# Patient Record
Sex: Female | Born: 1985 | Race: Black or African American | Hispanic: Yes | Marital: Single | Smoking: Current some day smoker
Health system: Southern US, Community
[De-identification: ages and names within clinical notes are randomized; demographics above are authoritative.]

## PROBLEM LIST (undated history)

## (undated) ENCOUNTER — Inpatient Hospital Stay (HOSPITAL_COMMUNITY): Payer: Self-pay

## (undated) DIAGNOSIS — I10 Essential (primary) hypertension: Secondary | ICD-10-CM

## (undated) DIAGNOSIS — D219 Benign neoplasm of connective and other soft tissue, unspecified: Secondary | ICD-10-CM

## (undated) DIAGNOSIS — Z3A36 36 weeks gestation of pregnancy: Secondary | ICD-10-CM

## (undated) DIAGNOSIS — Z3A38 38 weeks gestation of pregnancy: Secondary | ICD-10-CM

## (undated) DIAGNOSIS — Z3A33 33 weeks gestation of pregnancy: Secondary | ICD-10-CM

## (undated) DIAGNOSIS — E119 Type 2 diabetes mellitus without complications: Secondary | ICD-10-CM

## (undated) DIAGNOSIS — F99 Mental disorder, not otherwise specified: Secondary | ICD-10-CM

## (undated) DIAGNOSIS — Z3A20 20 weeks gestation of pregnancy: Secondary | ICD-10-CM

## (undated) DIAGNOSIS — Q928 Other specified trisomies and partial trisomies of autosomes: Secondary | ICD-10-CM

## (undated) DIAGNOSIS — O24419 Gestational diabetes mellitus in pregnancy, unspecified control: Secondary | ICD-10-CM

## (undated) DIAGNOSIS — Z3A35 35 weeks gestation of pregnancy: Secondary | ICD-10-CM

## (undated) HISTORY — PX: DILATION AND CURETTAGE OF UTERUS: SHX78

## (undated) HISTORY — DX: Essential (primary) hypertension: I10

## (undated) HISTORY — DX: Benign neoplasm of connective and other soft tissue, unspecified: D21.9

## (undated) HISTORY — DX: Mental disorder, not otherwise specified: F99

---

## 2013-08-10 DIAGNOSIS — F340 Cyclothymic disorder: Secondary | ICD-10-CM | POA: Diagnosis not present

## 2013-08-17 DIAGNOSIS — F340 Cyclothymic disorder: Secondary | ICD-10-CM | POA: Diagnosis not present

## 2013-08-24 DIAGNOSIS — F340 Cyclothymic disorder: Secondary | ICD-10-CM | POA: Diagnosis not present

## 2013-08-26 DIAGNOSIS — F340 Cyclothymic disorder: Secondary | ICD-10-CM | POA: Diagnosis not present

## 2013-09-07 DIAGNOSIS — F340 Cyclothymic disorder: Secondary | ICD-10-CM | POA: Diagnosis not present

## 2013-09-14 DIAGNOSIS — F340 Cyclothymic disorder: Secondary | ICD-10-CM | POA: Diagnosis not present

## 2013-12-19 DIAGNOSIS — O0990 Supervision of high risk pregnancy, unspecified, unspecified trimester: Secondary | ICD-10-CM | POA: Diagnosis not present

## 2013-12-19 DIAGNOSIS — N912 Amenorrhea, unspecified: Secondary | ICD-10-CM | POA: Diagnosis not present

## 2013-12-19 DIAGNOSIS — Z9189 Other specified personal risk factors, not elsewhere classified: Secondary | ICD-10-CM | POA: Diagnosis not present

## 2013-12-19 DIAGNOSIS — Z113 Encounter for screening for infections with a predominantly sexual mode of transmission: Secondary | ICD-10-CM | POA: Diagnosis not present

## 2013-12-19 DIAGNOSIS — Z01419 Encounter for gynecological examination (general) (routine) without abnormal findings: Secondary | ICD-10-CM | POA: Diagnosis not present

## 2013-12-19 DIAGNOSIS — N390 Urinary tract infection, site not specified: Secondary | ICD-10-CM | POA: Diagnosis not present

## 2013-12-21 DIAGNOSIS — Z3480 Encounter for supervision of other normal pregnancy, unspecified trimester: Secondary | ICD-10-CM | POA: Diagnosis not present

## 2013-12-21 DIAGNOSIS — O0990 Supervision of high risk pregnancy, unspecified, unspecified trimester: Secondary | ICD-10-CM | POA: Diagnosis not present

## 2013-12-21 DIAGNOSIS — N912 Amenorrhea, unspecified: Secondary | ICD-10-CM | POA: Diagnosis not present

## 2013-12-26 DIAGNOSIS — O3680X0 Pregnancy with inconclusive fetal viability, not applicable or unspecified: Secondary | ICD-10-CM | POA: Diagnosis not present

## 2013-12-26 DIAGNOSIS — O34219 Maternal care for unspecified type scar from previous cesarean delivery: Secondary | ICD-10-CM | POA: Diagnosis not present

## 2013-12-26 DIAGNOSIS — E663 Overweight: Secondary | ICD-10-CM | POA: Diagnosis not present

## 2014-01-09 DIAGNOSIS — O3680X0 Pregnancy with inconclusive fetal viability, not applicable or unspecified: Secondary | ICD-10-CM | POA: Diagnosis not present

## 2014-01-16 DIAGNOSIS — Z3480 Encounter for supervision of other normal pregnancy, unspecified trimester: Secondary | ICD-10-CM | POA: Diagnosis not present

## 2014-01-16 DIAGNOSIS — Z113 Encounter for screening for infections with a predominantly sexual mode of transmission: Secondary | ICD-10-CM | POA: Diagnosis not present

## 2014-01-16 DIAGNOSIS — Z36 Encounter for antenatal screening of mother: Secondary | ICD-10-CM | POA: Diagnosis not present

## 2014-01-16 LAB — OB RESULTS CONSOLE ANTIBODY SCREEN: Antibody Screen: NEGATIVE

## 2014-01-16 LAB — OB RESULTS CONSOLE PLATELET COUNT: PLATELETS: 276 10*3/uL

## 2014-01-16 LAB — OB RESULTS CONSOLE RUBELLA ANTIBODY, IGM: RUBELLA: IMMUNE

## 2014-01-16 LAB — OB RESULTS CONSOLE HEPATITIS B SURFACE ANTIGEN: Hepatitis B Surface Ag: NEGATIVE

## 2014-01-16 LAB — OB RESULTS CONSOLE VARICELLA ZOSTER ANTIBODY, IGG: Varicella: IMMUNE

## 2014-01-16 LAB — OB RESULTS CONSOLE ABO/RH: RH TYPE: POSITIVE

## 2014-01-16 LAB — OB RESULTS CONSOLE HGB/HCT, BLOOD
HCT: 36 %
Hemoglobin: 11.4 g/dL

## 2014-01-16 LAB — OB RESULTS CONSOLE HIV ANTIBODY (ROUTINE TESTING): HIV: NONREACTIVE

## 2014-01-16 LAB — OB RESULTS CONSOLE RPR: RPR: NONREACTIVE

## 2014-01-18 DIAGNOSIS — O210 Mild hyperemesis gravidarum: Secondary | ICD-10-CM | POA: Diagnosis not present

## 2014-01-18 DIAGNOSIS — O209 Hemorrhage in early pregnancy, unspecified: Secondary | ICD-10-CM | POA: Diagnosis not present

## 2014-01-18 DIAGNOSIS — O99330 Smoking (tobacco) complicating pregnancy, unspecified trimester: Secondary | ICD-10-CM | POA: Diagnosis not present

## 2014-01-18 DIAGNOSIS — O200 Threatened abortion: Secondary | ICD-10-CM | POA: Diagnosis not present

## 2014-01-23 DIAGNOSIS — N760 Acute vaginitis: Secondary | ICD-10-CM | POA: Diagnosis not present

## 2014-01-23 DIAGNOSIS — D509 Iron deficiency anemia, unspecified: Secondary | ICD-10-CM | POA: Diagnosis not present

## 2014-01-23 DIAGNOSIS — O99210 Obesity complicating pregnancy, unspecified trimester: Secondary | ICD-10-CM | POA: Diagnosis not present

## 2014-01-23 DIAGNOSIS — O200 Threatened abortion: Secondary | ICD-10-CM | POA: Diagnosis not present

## 2014-01-23 DIAGNOSIS — E669 Obesity, unspecified: Secondary | ICD-10-CM | POA: Diagnosis not present

## 2014-01-26 DIAGNOSIS — O200 Threatened abortion: Secondary | ICD-10-CM | POA: Diagnosis not present

## 2014-01-26 DIAGNOSIS — F319 Bipolar disorder, unspecified: Secondary | ICD-10-CM | POA: Diagnosis not present

## 2014-01-26 DIAGNOSIS — O99340 Other mental disorders complicating pregnancy, unspecified trimester: Secondary | ICD-10-CM | POA: Diagnosis not present

## 2014-01-29 DIAGNOSIS — O200 Threatened abortion: Secondary | ICD-10-CM | POA: Diagnosis not present

## 2014-02-02 DIAGNOSIS — O200 Threatened abortion: Secondary | ICD-10-CM | POA: Diagnosis not present

## 2014-02-02 DIAGNOSIS — Z9889 Other specified postprocedural states: Secondary | ICD-10-CM | POA: Diagnosis not present

## 2014-02-12 DIAGNOSIS — Z3480 Encounter for supervision of other normal pregnancy, unspecified trimester: Secondary | ICD-10-CM | POA: Diagnosis not present

## 2014-02-12 DIAGNOSIS — Z1389 Encounter for screening for other disorder: Secondary | ICD-10-CM | POA: Diagnosis not present

## 2014-02-12 DIAGNOSIS — O34219 Maternal care for unspecified type scar from previous cesarean delivery: Secondary | ICD-10-CM | POA: Diagnosis not present

## 2014-02-12 DIAGNOSIS — Z36 Encounter for antenatal screening of mother: Secondary | ICD-10-CM | POA: Diagnosis not present

## 2014-02-12 DIAGNOSIS — O358XX0 Maternal care for other (suspected) fetal abnormality and damage, not applicable or unspecified: Secondary | ICD-10-CM | POA: Diagnosis not present

## 2014-03-02 DIAGNOSIS — Z3480 Encounter for supervision of other normal pregnancy, unspecified trimester: Secondary | ICD-10-CM | POA: Diagnosis not present

## 2014-03-02 DIAGNOSIS — Z36 Encounter for antenatal screening of mother: Secondary | ICD-10-CM | POA: Diagnosis not present

## 2014-03-02 DIAGNOSIS — R300 Dysuria: Secondary | ICD-10-CM | POA: Diagnosis not present

## 2014-03-02 DIAGNOSIS — Z3400 Encounter for supervision of normal first pregnancy, unspecified trimester: Secondary | ICD-10-CM | POA: Diagnosis not present

## 2014-04-02 DIAGNOSIS — Z3480 Encounter for supervision of other normal pregnancy, unspecified trimester: Secondary | ICD-10-CM | POA: Diagnosis not present

## 2014-04-02 DIAGNOSIS — E669 Obesity, unspecified: Secondary | ICD-10-CM | POA: Diagnosis not present

## 2014-04-02 DIAGNOSIS — Z0373 Encounter for suspected fetal anomaly ruled out: Secondary | ICD-10-CM | POA: Diagnosis not present

## 2014-04-02 DIAGNOSIS — O99210 Obesity complicating pregnancy, unspecified trimester: Secondary | ICD-10-CM | POA: Diagnosis not present

## 2014-04-03 LAB — GLUCOSE TOLERANCE, 1 HOUR (50G) W/O FASTING: Glucose, 1 Hour GTT: 152

## 2014-04-10 DIAGNOSIS — Z3480 Encounter for supervision of other normal pregnancy, unspecified trimester: Secondary | ICD-10-CM | POA: Diagnosis not present

## 2014-04-10 DIAGNOSIS — N390 Urinary tract infection, site not specified: Secondary | ICD-10-CM | POA: Diagnosis not present

## 2014-04-19 DIAGNOSIS — Z3480 Encounter for supervision of other normal pregnancy, unspecified trimester: Secondary | ICD-10-CM | POA: Diagnosis not present

## 2014-04-19 DIAGNOSIS — O09299 Supervision of pregnancy with other poor reproductive or obstetric history, unspecified trimester: Secondary | ICD-10-CM | POA: Diagnosis not present

## 2014-04-19 DIAGNOSIS — O3440 Maternal care for other abnormalities of cervix, unspecified trimester: Secondary | ICD-10-CM | POA: Diagnosis not present

## 2014-04-19 DIAGNOSIS — E669 Obesity, unspecified: Secondary | ICD-10-CM | POA: Diagnosis not present

## 2014-04-19 DIAGNOSIS — O99210 Obesity complicating pregnancy, unspecified trimester: Secondary | ICD-10-CM | POA: Diagnosis not present

## 2014-04-20 LAB — GLUCOSE TOLERANCE, 3 HOURS
GLUCOSE 1 HOUR GTT: 175 mg/dL (ref ?–200)
GLUCOSE 2 HOUR GTT: 81 mg/dL (ref ?–140)
Glucose, GTT - 3 Hour: 64 mg/dL (ref ?–140)
Glucose, GTT - Fasting: 82 mg/dL (ref 80–110)

## 2014-05-30 DIAGNOSIS — O402XX0 Polyhydramnios, second trimester, not applicable or unspecified: Secondary | ICD-10-CM | POA: Diagnosis not present

## 2014-05-30 DIAGNOSIS — O99810 Abnormal glucose complicating pregnancy: Secondary | ICD-10-CM | POA: Diagnosis not present

## 2014-05-30 DIAGNOSIS — Z3482 Encounter for supervision of other normal pregnancy, second trimester: Secondary | ICD-10-CM | POA: Diagnosis not present

## 2014-05-30 DIAGNOSIS — O09292 Supervision of pregnancy with other poor reproductive or obstetric history, second trimester: Secondary | ICD-10-CM | POA: Diagnosis not present

## 2014-06-09 ENCOUNTER — Inpatient Hospital Stay (HOSPITAL_COMMUNITY)
Admission: AD | Admit: 2014-06-09 | Discharge: 2014-06-09 | Disposition: A | Discharge status: Home / Self Care | Payer: Medicare Other | Source: Ambulatory Visit | Attending: Obstetrics & Gynecology | Admitting: Obstetrics & Gynecology

## 2014-06-09 ENCOUNTER — Encounter (HOSPITAL_COMMUNITY): Payer: Self-pay

## 2014-06-09 DIAGNOSIS — O24419 Gestational diabetes mellitus in pregnancy, unspecified control: Secondary | ICD-10-CM

## 2014-06-09 DIAGNOSIS — O3421 Maternal care for scar from previous cesarean delivery: Secondary | ICD-10-CM | POA: Diagnosis not present

## 2014-06-09 DIAGNOSIS — R1031 Right lower quadrant pain: Secondary | ICD-10-CM | POA: Diagnosis not present

## 2014-06-09 DIAGNOSIS — O0933 Supervision of pregnancy with insufficient antenatal care, third trimester: Secondary | ICD-10-CM | POA: Diagnosis not present

## 2014-06-09 DIAGNOSIS — Z3A28 28 weeks gestation of pregnancy: Secondary | ICD-10-CM | POA: Diagnosis not present

## 2014-06-09 DIAGNOSIS — O0932 Supervision of pregnancy with insufficient antenatal care, second trimester: Secondary | ICD-10-CM

## 2014-06-09 HISTORY — DX: Gestational diabetes mellitus in pregnancy, unspecified control: O24.419

## 2014-06-09 HISTORY — DX: Type 2 diabetes mellitus without complications: E11.9

## 2014-06-09 LAB — URINALYSIS, ROUTINE W REFLEX MICROSCOPIC
GLUCOSE, UA: NEGATIVE mg/dL
Hgb urine dipstick: NEGATIVE
Ketones, ur: 15 mg/dL — AB
LEUKOCYTES UA: NEGATIVE
NITRITE: NEGATIVE
PH: 6 (ref 5.0–8.0)
Protein, ur: NEGATIVE mg/dL
Urobilinogen, UA: 1 mg/dL (ref 0.0–1.0)

## 2014-06-09 LAB — GLUCOSE, CAPILLARY: GLUCOSE-CAPILLARY: 102 mg/dL — AB (ref 70–99)

## 2014-06-09 NOTE — MAU Provider Note (Signed)
History    Patient is 28 y.o. [id number] [redacted]w[redacted]d here with complaints of sharp right lower quadrant pain where her scar is from previous c-sections. States that pain began apx 2 days ago and has had periodic ctx since then. Has had limited prenatal care in [place]. Her first child was born at 30wks via c/s due to failure to progress; subsequent pregnancies resulted in preterm deliveries, though pt is not sure re dates. She had GDM w last pregnancy and has been told that she has it w this pregnancy as well.  She checks her sugars and states that her fasting levels are usually 120 while her post-prandials are 180.   Pt denies fever, dysuria, difficulty w bm, n/v, weakness, ha, vision change, swelling.  +FM, denies LOF, VB, vaginal discharge. Had ctx earlier this evening (periodic) but they have since ceased.    CSN: [id number]  Arrival date & time 06/09/14  0009   First Provider Initiated Contact with Patient 06/09/14 0210      Chief Complaint  Patient presents with  . Abdominal Pain    HPI  Past Medical History  Diagnosis Date  . Diabetes mellitus without complication   . Gestational diabetes     Past Surgical History  Procedure Laterality Date  . Cesarean section    . Dilation and curettage of uterus      History reviewed. No pertinent family history.  History  Substance Use Topics  . Smoking status: Former Research scientist (life sciences)  . Smokeless tobacco: Never Used  . Alcohol Use: No    OB History    Gravida Para Term Preterm AB TAB SAB Ectopic Multiple Living   8 4 1 3 3 2 1   4       Review of Systems  As in HPI  Allergies  Review of patient's allergies indicates no known allergies.  Home Medications  No current outpatient prescriptions on file.  BP 125/63 mmHg  Pulse 88  Temp(Src) 98.2 F (36.8 C) (Oral)  Resp 18  Ht 5\' 6"  (1.676 m)  Wt 121.167 kg (267 lb 2 oz)  BMI 43.14 kg/m2  SpO2 97%  Physical Exam Well nourished, well developed. No acute distress. Resp: No resp  distress.  Cardio: HR normal. PP present and symmetric Abd: No rebound, guarding. Gravida. GU: No CVA tenderness.  Ext: No swelling. Cervical exam: Closed/th/high   MAU Course  Procedures (including critical care time)  Labs Reviewed  URINALYSIS, ROUTINE W REFLEX MICROSCOPIC - Abnormal; Notable for the following:    Specific Gravity, Urine >1.030 (*)    Bilirubin Urine SMALL (*)    Ketones, ur 15 (*)    All other components within normal limits   No results found.   1. Limited prenatal care in second trimester       MDM  NST: Reactive. Category I. No ctx on monitor.  Assessment/Plan Patient is 28 y.o. [id number] [redacted]w[redacted]d by [place] per pt who presented to MAU w sharp LRQ pain.  #Gestation of [redacted]w[redacted]d --Limited prenatal care --No outside records --Per pt is GDM --Plan for [place] this week --Plan for prenatal care here in [place] regarding diet control for sugar  #GDM --POC glucose 102 --Urine w slight ketones --[facility] follow up  #Pain --Does not appear to be contracting on monitor --No dilation on exam --Does not appear to be preterm labor --No evidence of cystitis, pyelonephritis, calculus, appendicitis --Advised to return if pain worsens, contractions return w regularity  OB fellow attestation:  I have seen and examined this patient; I agree with above documentation in the resident's note.   Zissy Hamlett is a 28 y.o. [id number] reporting right lower abdominal pain at incision site.  Patient reports 4 previous cesarean sections, was told she could die with next cesarean section and is concerned about pain.  Reports has thought about BTS but is undecided.  Feels something under incision, not new +FM, denies LOF, VB, contractions, vaginal discharge.  PE: BP 134/75 mmHg  Pulse 87  Temp(Src) 97.5 F (36.4 C) (Oral)  Resp 18  Ht 5\' 6"  (1.676 m)  Wt 267 lb 2 oz (121.167 kg)  BMI 43.14 kg/m2  SpO2 97% Gen: calm comfortable, NAD Resp: normal  effort, no distress Abd: gravid, deep to right lateral incision fibrous nodules palpated which are tender, no rebound tenderness  ROS, labs, PMH reviewed NST reactive  Plan:  [id number] [redacted]w[redacted]d with gestational DM complaints of abdominal pain likely secondary to scar tissue, possible adhesions - fetal kick counts reinforced, preterm labor precautions - continue routine follow up in OB clinic - outpt follow up sono ordered given suspected A/BDM, reinforced need to follow diabetic diet and risk to baby.  - discussed general risks of cesarean section, risk of uterine rupture and patient now considering BTS  Merla Riches, MD 8:46 AM

## 2014-06-09 NOTE — Discharge Instructions (Signed)
Abdominal Pain During Pregnancy °Belly (abdominal) pain is common during pregnancy. Most of the time, it is not a serious problem. Other times, it can be a sign that something is wrong with the pregnancy. Always tell your doctor if you have belly pain. °HOME CARE °Monitor your belly pain for any changes. The following actions may help you feel better: °· Do not have sex (intercourse) or put anything in your vagina until you feel better. °· Rest until your pain stops. °· Drink clear fluids if you feel sick to your stomach (nauseous). Do not eat solid food until you feel better. °· Only take medicine as told by your doctor. °· Keep all doctor visits as told. °GET HELP RIGHT AWAY IF:  °· You are bleeding, leaking fluid, or pieces of tissue come out of your vagina. °· You have more pain or cramping. °· You keep throwing up (vomiting). °· You have pain when you pee (urinate) or have blood in your pee. °· You have a fever. °· You do not feel your baby moving as much. °· You feel very weak or feel like passing out. °· You have trouble breathing, with or without belly pain. °· You have a very bad headache and belly pain. °· You have fluid leaking from your vagina and belly pain. °· You keep having watery poop (diarrhea). °· Your belly pain does not go away after resting, or the pain gets worse. °MAKE SURE YOU:  °· Understand these instructions. °· Will watch your condition. °· Will get help right away if you are not doing well or get worse. °Document Released: 07/08/2009 Document Revised: 03/22/2013 Document Reviewed: 02/16/2013 °ExitCare® Patient Information ©2015 [employer]. This information is not intended to replace advice given to you by your health care provider. Make sure you discuss any questions you have with your health care provider. ° °

## 2014-06-09 NOTE — MAU Note (Signed)
"  On and Off" Abd pain at right lower abd area x 2 days.  Concerned that blood sugar was 115 this morning fasting and was 180, two hours after eating.  Is Gest DM but diet controlled.  Is from [place], has not started [facility] in our hour yet, reports is waiting for her prenatal records to arrive at the [facility]  No bleeding, no leaking.  Baby moving well.

## 2014-06-12 ENCOUNTER — Other Ambulatory Visit (HOSPITAL_COMMUNITY): Payer: Self-pay | Admitting: Family Medicine

## 2014-06-12 ENCOUNTER — Encounter: Payer: Self-pay | Admitting: *Deleted

## 2014-06-12 ENCOUNTER — Ambulatory Visit (HOSPITAL_COMMUNITY)
Admission: RE | Admit: 2014-06-12 | Discharge: 2014-06-12 | Disposition: A | Discharge status: Home / Self Care | Payer: Medicare Other | Source: Ambulatory Visit | Attending: Family Medicine | Admitting: Family Medicine

## 2014-06-12 ENCOUNTER — Other Ambulatory Visit: Payer: Self-pay | Admitting: Advanced Practice Midwife

## 2014-06-12 DIAGNOSIS — O99343 Other mental disorders complicating pregnancy, third trimester: Principal | ICD-10-CM

## 2014-06-12 DIAGNOSIS — O24410 Gestational diabetes mellitus in pregnancy, diet controlled: Secondary | ICD-10-CM | POA: Diagnosis not present

## 2014-06-12 DIAGNOSIS — Z3A29 29 weeks gestation of pregnancy: Secondary | ICD-10-CM | POA: Insufficient documentation

## 2014-06-12 DIAGNOSIS — Z8632 Personal history of gestational diabetes: Secondary | ICD-10-CM | POA: Insufficient documentation

## 2014-06-12 DIAGNOSIS — O352XX0 Maternal care for (suspected) hereditary disease in fetus, not applicable or unspecified: Secondary | ICD-10-CM | POA: Diagnosis not present

## 2014-06-12 DIAGNOSIS — O0933 Supervision of pregnancy with insufficient antenatal care, third trimester: Secondary | ICD-10-CM | POA: Diagnosis not present

## 2014-06-12 DIAGNOSIS — O09213 Supervision of pregnancy with history of pre-term labor, third trimester: Secondary | ICD-10-CM | POA: Diagnosis not present

## 2014-06-12 DIAGNOSIS — O3421 Maternal care for scar from previous cesarean delivery: Secondary | ICD-10-CM | POA: Diagnosis not present

## 2014-06-12 DIAGNOSIS — O0932 Supervision of pregnancy with insufficient antenatal care, second trimester: Secondary | ICD-10-CM

## 2014-06-12 DIAGNOSIS — F319 Bipolar disorder, unspecified: Secondary | ICD-10-CM

## 2014-06-12 DIAGNOSIS — Z87798 Personal history of other (corrected) congenital malformations: Secondary | ICD-10-CM | POA: Insufficient documentation

## 2014-06-12 DIAGNOSIS — O24419 Gestational diabetes mellitus in pregnancy, unspecified control: Secondary | ICD-10-CM | POA: Insufficient documentation

## 2014-06-12 DIAGNOSIS — Z8659 Personal history of other mental and behavioral disorders: Secondary | ICD-10-CM | POA: Insufficient documentation

## 2014-06-12 NOTE — Progress Notes (Signed)
Reviewed results of anatomy scan. Normal, but incomplete. F/U in 4 weeks. Pt tearful. States she is very worried that she and her baby will die from uterine rupture because of Hx C/S x 4. Discussed that risk is present, but low. Warning signs reviewed.  Inquired about other issues w/ anxiety. Pt denies. F/U at [facility] next week as scheduled.

## 2014-06-18 ENCOUNTER — Encounter: Payer: Self-pay | Admitting: Obstetrics & Gynecology

## 2014-06-18 ENCOUNTER — Encounter: Payer: Medicare Other | Attending: Obstetrics & Gynecology | Admitting: *Deleted

## 2014-06-18 ENCOUNTER — Ambulatory Visit (INDEPENDENT_AMBULATORY_CARE_PROVIDER_SITE_OTHER): Payer: Medicare Other | Admitting: Obstetrics & Gynecology

## 2014-06-18 VITALS — Temp 99.4°F | Wt 267.0 lb

## 2014-06-18 DIAGNOSIS — J069 Acute upper respiratory infection, unspecified: Secondary | ICD-10-CM | POA: Diagnosis not present

## 2014-06-18 DIAGNOSIS — O09219 Supervision of pregnancy with history of pre-term labor, unspecified trimester: Secondary | ICD-10-CM

## 2014-06-18 DIAGNOSIS — O3421 Maternal care for scar from previous cesarean delivery: Secondary | ICD-10-CM | POA: Diagnosis not present

## 2014-06-18 DIAGNOSIS — O34219 Maternal care for unspecified type scar from previous cesarean delivery: Secondary | ICD-10-CM | POA: Insufficient documentation

## 2014-06-18 DIAGNOSIS — O24419 Gestational diabetes mellitus in pregnancy, unspecified control: Secondary | ICD-10-CM

## 2014-06-18 DIAGNOSIS — Z713 Dietary counseling and surveillance: Secondary | ICD-10-CM | POA: Insufficient documentation

## 2014-06-18 DIAGNOSIS — O0933 Supervision of pregnancy with insufficient antenatal care, third trimester: Secondary | ICD-10-CM

## 2014-06-18 MED ORDER — AZITHROMYCIN 250 MG PO TABS: ORAL_TABLET | ORAL | Status: DC

## 2014-06-18 MED ORDER — PROMETHAZINE HCL 25 MG PO TABS: 25.0000 mg | ORAL_TABLET | Freq: Four times a day (QID) | ORAL | Status: DC | PRN

## 2014-06-18 NOTE — Progress Notes (Signed)
Nutrition note: 1st visit consult & GDM diet education Pt has GDM & h/o obesity. Pt has gained 2# @ 30w, which is < expected. Pt reports eating 6-7x/d but small amounts each time. Pt is taking a PNV. Pt reports no N/V but has some heartburn. Pt received verbal & written education on GDM diet. Discussed tips to decrease heartburn. Discussed benefits of BF. Discussed wt gain goals of 11-20# or 0.5#/wk. Pt agrees to follow GDM diet with 3 meals & 3 snacks/d with proper CHO/ protein combination. Pt does not have Felicia Foster but plans to apply. Pt is unsure about BF. F/u in 2-4 wks Vladimir Faster, MS, RD, LDN, [facility]

## 2014-06-18 NOTE — Progress Notes (Signed)
Patient scheduled to see Felicia Foster  today for DM education but reports URI symptoms and low grade temp. URI symptoms not responding to any OTC meds.  Temp here 99.4. Advised patient to go to MAU for evaluation but she declined this due to childcare.  Prescribed Azithromycin pack and Phenergan (for nausea) Emphasized that if she does not get any better, she will need evaluation for influenza and further evaluation/management in the MAU. Please see Nancy's note for more details about DM education.

## 2014-06-18 NOTE — Patient Instructions (Signed)
Return to clinic for any obstetric concerns or go to MAU for evaluation  

## 2014-06-18 NOTE — Progress Notes (Signed)
Patient presents for diabetes education. This is her first visit to [facility]. She has moved from [place] 05/18/14. She has been receiving prenatal care. She has a [employer] IQ and adequate testing supplies at this time. She has not tested since 06/06/14. Available readings prior to that date show FBS range 93-113, and 2hpp 117-152m/dl. She is not eating nor testing as directed due to her not feeling well and not eating well at this time. She states she has not met with a dietitian/nutritionist since her pregnancy.  I have consulted Dr. AHarolyn Rutherforddue to patient upper respiratory congestion, fatigue, hot, cold body temp changes, nausea. Oral temp 99.4degrees. Her insurance at this time is Medicare disability due to psychiatric diagnosis. She noted she is attempting to get pregnancy medicaid as well.

## 2014-06-25 ENCOUNTER — Other Ambulatory Visit: Payer: Self-pay | Admitting: Obstetrics & Gynecology

## 2014-06-25 ENCOUNTER — Encounter: Payer: Self-pay | Admitting: Obstetrics & Gynecology

## 2014-06-25 ENCOUNTER — Ambulatory Visit (INDEPENDENT_AMBULATORY_CARE_PROVIDER_SITE_OTHER): Payer: Medicare Other | Admitting: Obstetrics & Gynecology

## 2014-06-25 VITALS — BP 126/84 | HR 103 | Temp 98.4°F | Wt 165.8 lb

## 2014-06-25 DIAGNOSIS — O99810 Abnormal glucose complicating pregnancy: Secondary | ICD-10-CM | POA: Diagnosis not present

## 2014-06-25 DIAGNOSIS — A64 Unspecified sexually transmitted disease: Secondary | ICD-10-CM

## 2014-06-25 DIAGNOSIS — O0933 Supervision of pregnancy with insufficient antenatal care, third trimester: Secondary | ICD-10-CM

## 2014-06-25 DIAGNOSIS — O3421 Maternal care for scar from previous cesarean delivery: Secondary | ICD-10-CM

## 2014-06-25 DIAGNOSIS — O24419 Gestational diabetes mellitus in pregnancy, unspecified control: Secondary | ICD-10-CM

## 2014-06-25 DIAGNOSIS — R7302 Impaired glucose tolerance (oral): Secondary | ICD-10-CM

## 2014-06-25 DIAGNOSIS — O98319 Other infections with a predominantly sexual mode of transmission complicating pregnancy, unspecified trimester: Secondary | ICD-10-CM

## 2014-06-25 DIAGNOSIS — O34219 Maternal care for unspecified type scar from previous cesarean delivery: Secondary | ICD-10-CM

## 2014-06-25 DIAGNOSIS — R7309 Other abnormal glucose: Secondary | ICD-10-CM

## 2014-06-25 DIAGNOSIS — E0865 Diabetes mellitus due to underlying condition with hyperglycemia: Secondary | ICD-10-CM | POA: Diagnosis not present

## 2014-06-25 LAB — POCT URINALYSIS DIP (DEVICE)
BILIRUBIN URINE: NEGATIVE
Glucose, UA: NEGATIVE mg/dL
Hgb urine dipstick: NEGATIVE
KETONES UR: NEGATIVE mg/dL
LEUKOCYTES UA: NEGATIVE
Nitrite: NEGATIVE
Protein, ur: NEGATIVE mg/dL
Specific Gravity, Urine: 1.025 (ref 1.005–1.030)
Urobilinogen, UA: 1 mg/dL (ref 0.0–1.0)
pH: 6.5 (ref 5.0–8.0)

## 2014-06-25 LAB — COMPREHENSIVE METABOLIC PANEL
ALK PHOS: 83 U/L (ref 39–117)
ALT: 12 U/L (ref 0–35)
AST: 12 U/L (ref 0–37)
Albumin: 3.2 g/dL — ABNORMAL LOW (ref 3.5–5.2)
BILIRUBIN TOTAL: 0.2 mg/dL (ref 0.2–1.2)
BUN: 6 mg/dL (ref 6–23)
CO2: 20 mEq/L (ref 19–32)
Calcium: 8.7 mg/dL (ref 8.4–10.5)
Chloride: 106 mEq/L (ref 96–112)
Creat: 0.47 mg/dL — ABNORMAL LOW (ref 0.50–1.10)
Glucose, Bld: 99 mg/dL (ref 70–99)
Potassium: 3.8 mEq/L (ref 3.5–5.3)
SODIUM: 137 meq/L (ref 135–145)
TOTAL PROTEIN: 6.4 g/dL (ref 6.0–8.3)

## 2014-06-25 LAB — CBC
HCT: 30.9 % — ABNORMAL LOW (ref 36.0–46.0)
HEMOGLOBIN: 10.2 g/dL — AB (ref 12.0–15.0)
MCH: 25.1 pg — ABNORMAL LOW (ref 26.0–34.0)
MCHC: 33 g/dL (ref 30.0–36.0)
MCV: 75.9 fL — ABNORMAL LOW (ref 78.0–100.0)
MPV: 10.6 fL (ref 9.4–12.4)
Platelets: 242 10*3/uL (ref 150–400)
RBC: 4.07 MIL/uL (ref 3.87–5.11)
RDW: 13.9 % (ref 11.5–15.5)
WBC: 5.5 10*3/uL (ref 4.0–10.5)

## 2014-06-25 NOTE — Patient Instructions (Signed)
Return to clinic for any obstetric concerns or go to MAU for evaluation  

## 2014-06-25 NOTE — Progress Notes (Signed)
Transferred care from [place], awaiting records. On review of blood sugars since last week; only 2-3 values per day. Patient reports eating one per day.   Fasting 102, 97, 84,91 92, 96  2 hr PP < 120s except for one 141.  Continue diet adherence. Third trimester labs today. No other complaints or concerns.  Labor and fetal movement precautions reviewed.

## 2014-06-25 NOTE — Progress Notes (Signed)
Declined flu/Tdap vaccine Pt reports right leg goes numb. Pt had started care in [place], attempting to receive records.

## 2014-06-26 ENCOUNTER — Encounter: Payer: Self-pay | Admitting: *Deleted

## 2014-06-26 ENCOUNTER — Other Ambulatory Visit: Payer: Self-pay | Admitting: *Deleted

## 2014-06-26 ENCOUNTER — Encounter: Payer: Self-pay | Admitting: Obstetrics & Gynecology

## 2014-06-26 DIAGNOSIS — IMO0002 Reserved for concepts with insufficient information to code with codable children: Secondary | ICD-10-CM

## 2014-06-26 DIAGNOSIS — Z0489 Encounter for examination and observation for other specified reasons: Secondary | ICD-10-CM

## 2014-06-26 LAB — HEMOGLOBIN A1C
HEMOGLOBIN A1C: 6 % — AB (ref <5.7)
Mean Plasma Glucose: 126 mg/dL — ABNORMAL HIGH (ref <117)

## 2014-06-26 LAB — PROTEIN / CREATININE RATIO, URINE
Creatinine, Urine: 218.1 mg/dL
PROTEIN CREATININE RATIO: 0.11 (ref <0.15)
Total Protein, Urine: 25 mg/dL — ABNORMAL HIGH (ref 5–24)

## 2014-06-26 LAB — HIV ANTIBODY (ROUTINE TESTING W REFLEX): HIV 1&2 Ab, 4th Generation: NONREACTIVE

## 2014-06-26 LAB — RPR

## 2014-06-26 NOTE — Addendum Note (Signed)
Addended by: Novella Olive on: 06/26/2014 09:01 AM   Modules accepted: Orders

## 2014-07-03 ENCOUNTER — Encounter (HOSPITAL_COMMUNITY): Payer: Self-pay

## 2014-07-03 ENCOUNTER — Encounter: Payer: Self-pay | Admitting: Obstetrics & Gynecology

## 2014-07-03 ENCOUNTER — Telehealth: Payer: Self-pay

## 2014-07-03 DIAGNOSIS — R12 Heartburn: Secondary | ICD-10-CM

## 2014-07-03 MED ORDER — PANTOPRAZOLE SODIUM 20 MG PO TBEC: 20.0000 mg | DELAYED_RELEASE_TABLET | Freq: Every day | ORAL | Status: DC

## 2014-07-03 NOTE — Telephone Encounter (Signed)
Patient called clinic stating she has been woken up every hour from a contraction and wants to know if she needs to be seen. Patient states she has been having about 1 contraction an hour-- started last night, stopped and then started again this morning. Informed patient that we would be concerned if she were having 6+ contractions an hour, any LOF or bleeding. Patient denies having more than one an hour, LOF or bleeding. Advised patient lay down and drink water. Discussed true signs of labor and advised that if she begins to have 6+ contractions an hour or experiences any LOF or bleeding she come to MAU. Patient verbalized understanding. Patient also c/o of heartburn and being unable to keep food down-- reports having no appetite. Dierdre Conard Novak, CNM agreed to prescribe Protonix 20mg  daily for patient-- patient informed. NO further questions or concerns.

## 2014-07-04 ENCOUNTER — Encounter: Payer: Self-pay | Admitting: *Deleted

## 2014-07-09 ENCOUNTER — Ambulatory Visit (HOSPITAL_COMMUNITY)
Admission: RE | Admit: 2014-07-09 | Discharge: 2014-07-09 | Disposition: A | Discharge status: Home / Self Care | Payer: Medicare Other | Source: Ambulatory Visit | Attending: Obstetrics & Gynecology | Admitting: Obstetrics & Gynecology

## 2014-07-09 ENCOUNTER — Encounter: Payer: Medicare Other | Admitting: Obstetrics & Gynecology

## 2014-07-09 DIAGNOSIS — Z0489 Encounter for examination and observation for other specified reasons: Secondary | ICD-10-CM

## 2014-07-09 DIAGNOSIS — IMO0002 Reserved for concepts with insufficient information to code with codable children: Secondary | ICD-10-CM

## 2014-07-09 DIAGNOSIS — Z36 Encounter for antenatal screening of mother: Secondary | ICD-10-CM | POA: Insufficient documentation

## 2014-07-09 DIAGNOSIS — Z3A33 33 weeks gestation of pregnancy: Secondary | ICD-10-CM | POA: Insufficient documentation

## 2014-07-09 DIAGNOSIS — O24410 Gestational diabetes mellitus in pregnancy, diet controlled: Secondary | ICD-10-CM | POA: Diagnosis not present

## 2014-07-09 DIAGNOSIS — O0933 Supervision of pregnancy with insufficient antenatal care, third trimester: Secondary | ICD-10-CM | POA: Diagnosis not present

## 2014-07-09 DIAGNOSIS — O99213 Obesity complicating pregnancy, third trimester: Secondary | ICD-10-CM | POA: Diagnosis not present

## 2014-07-09 DIAGNOSIS — O3421 Maternal care for scar from previous cesarean delivery: Secondary | ICD-10-CM | POA: Diagnosis not present

## 2014-07-09 NOTE — Progress Notes (Signed)
Cobie S [name]  was seen today for an ultrasound appointment.  See scanned report in media section of EPIC.  Impression: Single IUP at 33w 0d The estimated fetal weight today is at the 71st %tile for gestational age (2317g). Normal interval anatomy Somewhat limited views of the spine and face (upper lip) obtained. Posterior placenta without previa Normal amniotic fluid volume  Recommendations: Recommend follow up ultrasound in 3-4 weeks for interval growth due to GDM.  Benjaman Lobe, MD

## 2014-07-13 ENCOUNTER — Encounter: Payer: Medicare Other | Admitting: Advanced Practice Midwife

## 2014-07-13 ENCOUNTER — Ambulatory Visit (INDEPENDENT_AMBULATORY_CARE_PROVIDER_SITE_OTHER): Payer: Medicare Other | Admitting: Advanced Practice Midwife

## 2014-07-13 VITALS — BP 119/82 | HR 103 | Wt 270.2 lb

## 2014-07-13 DIAGNOSIS — O0933 Supervision of pregnancy with insufficient antenatal care, third trimester: Secondary | ICD-10-CM

## 2014-07-13 LAB — POCT URINALYSIS DIP (DEVICE)
GLUCOSE, UA: NEGATIVE mg/dL
Hgb urine dipstick: NEGATIVE
Ketones, ur: NEGATIVE mg/dL
LEUKOCYTES UA: NEGATIVE
NITRITE: NEGATIVE
Protein, ur: NEGATIVE mg/dL
Specific Gravity, Urine: 1.015 (ref 1.005–1.030)
Urobilinogen, UA: 2 mg/dL — ABNORMAL HIGH (ref 0.0–1.0)
pH: 8.5 — ABNORMAL HIGH (ref 5.0–8.0)

## 2014-07-13 LAB — GLUCOSE, CAPILLARY: Glucose-Capillary: 81 mg/dL (ref 70–99)

## 2014-07-13 MED ORDER — GLUCOSE BLOOD VI STRP: ORAL_STRIP | Status: DC

## 2014-07-13 MED ORDER — CLOTRIMAZOLE-BETAMETHASONE 1-0.05 % EX CREA: 1.0000 "application " | TOPICAL_CREAM | Freq: Two times a day (BID) | CUTANEOUS | Status: DC

## 2014-07-13 MED ORDER — ONETOUCH ULTRASOFT LANCETS MISC: Status: DC

## 2014-07-13 NOTE — Progress Notes (Signed)
Doing well.  Good fetal movement, denies vaginal bleeding, LOF, regular contractions.  Does report some painful contractions yesterday, 4-5/hour.  Also reports heartburn daily but not taking Protonix, rash under left breast, and possible hemorrhoids.  Resume Protonix daily, Tums PRN x next few days until Protonix working well. Lotrisone cream for rash.   Upon visualization, 1 external hemorrhoid noted, not thrombosed.  Discussed use of OTC hemorrhoid creams.  Pt does not have glucose log, has not had lancets/strips.  Reordered materials for pt.  Appt on Monday to f/u on glucose testing.

## 2014-07-13 NOTE — Progress Notes (Signed)
Pt is complaining of heartburn on medication, pt reports burning is down but acid is coming up during the night.\ Pt thinks she has hemorrhoids Pt reports dryness during intercourse Pt needs supplies for glucose meter.

## 2014-07-15 ENCOUNTER — Inpatient Hospital Stay (HOSPITAL_COMMUNITY)
Admission: AD | Admit: 2014-07-15 | Discharge: 2014-07-16 | Disposition: A | Discharge status: Home / Self Care | Payer: Medicare Other | Source: Ambulatory Visit | Attending: Obstetrics and Gynecology | Admitting: Obstetrics and Gynecology

## 2014-07-15 ENCOUNTER — Encounter (HOSPITAL_COMMUNITY): Payer: Self-pay | Admitting: *Deleted

## 2014-07-15 DIAGNOSIS — O0933 Supervision of pregnancy with insufficient antenatal care, third trimester: Secondary | ICD-10-CM

## 2014-07-15 DIAGNOSIS — Z3A33 33 weeks gestation of pregnancy: Secondary | ICD-10-CM | POA: Insufficient documentation

## 2014-07-15 DIAGNOSIS — O24419 Gestational diabetes mellitus in pregnancy, unspecified control: Secondary | ICD-10-CM

## 2014-07-15 DIAGNOSIS — O9989 Other specified diseases and conditions complicating pregnancy, childbirth and the puerperium: Secondary | ICD-10-CM | POA: Diagnosis not present

## 2014-07-15 DIAGNOSIS — O352XX1 Maternal care for (suspected) hereditary disease in fetus, fetus 1: Secondary | ICD-10-CM

## 2014-07-15 DIAGNOSIS — Z87891 Personal history of nicotine dependence: Secondary | ICD-10-CM | POA: Diagnosis not present

## 2014-07-15 DIAGNOSIS — Z3A34 34 weeks gestation of pregnancy: Secondary | ICD-10-CM | POA: Diagnosis not present

## 2014-07-15 DIAGNOSIS — O34219 Maternal care for unspecified type scar from previous cesarean delivery: Secondary | ICD-10-CM

## 2014-07-15 DIAGNOSIS — Y93E5 Activity, floor mopping and cleaning: Secondary | ICD-10-CM | POA: Insufficient documentation

## 2014-07-15 DIAGNOSIS — O09219 Supervision of pregnancy with history of pre-term labor, unspecified trimester: Secondary | ICD-10-CM

## 2014-07-15 DIAGNOSIS — O4703 False labor before 37 completed weeks of gestation, third trimester: Secondary | ICD-10-CM | POA: Diagnosis not present

## 2014-07-15 DIAGNOSIS — W01198A Fall on same level from slipping, tripping and stumbling with subsequent striking against other object, initial encounter: Secondary | ICD-10-CM | POA: Insufficient documentation

## 2014-07-15 LAB — PROTEIN / CREATININE RATIO, URINE
CREATININE, URINE: 289.31 mg/dL
PROTEIN CREATININE RATIO: 0.25 — AB (ref 0.00–0.15)
Total Protein, Urine: 72.9 mg/dL

## 2014-07-15 LAB — URINALYSIS, ROUTINE W REFLEX MICROSCOPIC
Bilirubin Urine: NEGATIVE
Glucose, UA: NEGATIVE mg/dL
Hgb urine dipstick: NEGATIVE
Ketones, ur: NEGATIVE mg/dL
Leukocytes, UA: NEGATIVE
NITRITE: NEGATIVE
PROTEIN: 100 mg/dL — AB
Specific Gravity, Urine: 1.02 (ref 1.005–1.030)
UROBILINOGEN UA: 1 mg/dL (ref 0.0–1.0)
pH: 7 (ref 5.0–8.0)

## 2014-07-15 LAB — CBC
HEMATOCRIT: 30.8 % — AB (ref 36.0–46.0)
Hemoglobin: 10 g/dL — ABNORMAL LOW (ref 12.0–15.0)
MCH: 24.7 pg — ABNORMAL LOW (ref 26.0–34.0)
MCHC: 32.5 g/dL (ref 30.0–36.0)
MCV: 76 fL — AB (ref 78.0–100.0)
Platelets: 230 10*3/uL (ref 150–400)
RBC: 4.05 MIL/uL (ref 3.87–5.11)
RDW: 13.2 % (ref 11.5–15.5)
WBC: 6.8 10*3/uL (ref 4.0–10.5)

## 2014-07-15 LAB — COMPREHENSIVE METABOLIC PANEL
ALT: 8 U/L (ref 0–35)
ANION GAP: 13 (ref 5–15)
AST: 9 U/L (ref 0–37)
Albumin: 2.3 g/dL — ABNORMAL LOW (ref 3.5–5.2)
Alkaline Phosphatase: 95 U/L (ref 39–117)
BILIRUBIN TOTAL: 0.2 mg/dL — AB (ref 0.3–1.2)
BUN: 7 mg/dL (ref 6–23)
CO2: 21 mEq/L (ref 19–32)
CREATININE: 0.62 mg/dL (ref 0.50–1.10)
Calcium: 8.8 mg/dL (ref 8.4–10.5)
Chloride: 103 mEq/L (ref 96–112)
GFR calc non Af Amer: >90 mL/min (ref >90)
Glucose, Bld: 101 mg/dL — ABNORMAL HIGH (ref 70–99)
Potassium: 3.9 mEq/L (ref 3.7–5.3)
Sodium: 137 mEq/L (ref 137–147)
TOTAL PROTEIN: 6.5 g/dL (ref 6.0–8.3)

## 2014-07-15 LAB — GLUCOSE, CAPILLARY: Glucose-Capillary: 95 mg/dL (ref 70–99)

## 2014-07-15 LAB — URINE MICROSCOPIC-ADD ON

## 2014-07-15 MED ORDER — OXYCODONE-ACETAMINOPHEN 5-325 MG PO TABS
1.0000 | ORAL_TABLET | Freq: Once | ORAL | Status: AC
  Administered 2014-07-15: 1 via ORAL
  Filled 2014-07-15: qty 1

## 2014-07-15 MED ORDER — NIFEDIPINE 10 MG PO CAPS
10.0000 mg | ORAL_CAPSULE | Freq: Once | ORAL | Status: AC
  Administered 2014-07-15: 10 mg via ORAL
  Filled 2014-07-15: qty 1

## 2014-07-15 NOTE — MAU Note (Signed)
Pt has not taken blood sugar in five days. Pharmacy did not have lancets and she did not feel well enough to go pick them up when they were available.

## 2014-07-15 NOTE — MAU Note (Signed)
Pt states reports falling at about 4:30 this afternoon. Pt landed on her L hip. She reports pain in her pelvis and lower back. Denies leaking or bleeding. +FM. Pt states that she has been having contractions since earlier today, but the contractions are now stronger since the fall.

## 2014-07-15 NOTE — MAU Note (Signed)
CBG 95mg /dl

## 2014-07-15 NOTE — MAU Provider Note (Signed)
History     CSN: [id number]  Arrival date and time: 07/15/14 9242   First Provider Initiated Contact with Patient 07/15/14 2101      Chief Complaint  Patient presents with  . Fall  . Contractions   HPI  Patient is 28 y.o. [id number] [redacted]w[redacted]d here with complaints of fall.  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  She denies any trauma to abdomen or head.  No LOC.    Since her fall, her contractions have increased in frequency to about q81min.  She is also having pain in pelvis.  She reports recent transfer of [facility] to [facility] from [place].  Reports they are uncure of GDM status.  +FM, denies LOF, VB, vaginal discharge.     Past Medical History  Diagnosis Date  . Fibroids   . Mental disorder     bipolar  . Diabetes mellitus without complication   . Gestational diabetes     Past Surgical History  Procedure Laterality Date  . Cesarean section      x 4  . Dilation and curettage of uterus      Family History  Problem Relation Age of Onset  . Cancer Mother     History  Substance Use Topics  . Smoking status: Former Research scientist (life sciences)  . Smokeless tobacco: Never Used  . Alcohol Use: No    Allergies: No Known Allergies  Prescriptions prior to admission  Medication Sig Dispense Refill Last Dose  . clotrimazole-betamethasone (LOTRISONE) cream Apply 1 application topically 2 (two) times daily. 30 g 0 07/15/2014 at Unknown time  . famotidine (PEPCID) 10 MG tablet Take 10 mg by mouth 2 (two) times daily.   07/14/2014 at Unknown time  . pantoprazole (PROTONIX) 20 MG tablet Take 1 tablet (20 mg total) by mouth daily. 30 tablet 1 Past Month at Unknown time  . Prenatal Vit-Fe Fumarate-FA (PRENATAL MULTIVITAMIN) TABS tablet Take 1 tablet by mouth daily at 12 noon.   07/13/2014 at Unknown time  . promethazine (PHENERGAN) 25 MG tablet Take 1 tablet (25 mg total) by mouth every 6 (six) hours as needed for nausea or vomiting. 30 tablet 2 Past Month at Unknown  time  . azithromycin (ZITHROMAX) 250 MG tablet Take as directed: Two pills the first day, then one pill every day until completed (Patient not taking: Reported on 07/13/2014) 6 tablet 1 Not Taking  . glucose blood test strip Use as instructed 100 each 12   . Lancets (ONETOUCH ULTRASOFT) lancets Use as instructed 100 each 12     Review of Systems  Constitutional: Negative for fever, chills and weight loss.  Eyes: Negative for blurred vision.  Respiratory: Negative for cough and shortness of breath.   Cardiovascular: Negative for chest pain and leg swelling.  Gastrointestinal: Negative for nausea, vomiting and abdominal pain.  Genitourinary: Negative for dysuria, urgency, frequency, hematuria and flank pain.  Musculoskeletal: Negative for back pain.  Skin: Negative for itching.  Neurological: Positive for headaches.   Physical Exam   Blood pressure 140/78, pulse 100, temperature 98.7 F (37.1 C), resp. rate 18, height 5\' 6"  (1.676 m), weight 123.106 kg (271 lb 6.4 oz), last menstrual period 11/14/2013.  Physical Exam  Constitutional: She is oriented to person, place, and time. She appears well-developed and well-nourished. No distress.  HENT:  Head: Normocephalic and atraumatic.  Cardiovascular: Normal rate and intact distal pulses.   Respiratory: Effort normal. No respiratory distress.  GI: There is no tenderness.  There is no rebound and no guarding.  Genitourinary: Vagina normal.  Cervix: Closed, thick, high  Musculoskeletal: She exhibits no edema or tenderness.  Neurological: She is alert and oriented to person, place, and time.  Skin: Skin is warm and dry.    MAU Course  Procedures  MDM NST: Cat 1, reactive, initially tachy but resolved with PO intake Initially with contractions q3-7 min on toco - spaced out and uterine irritability present s/p 2 doses of Procardia BP initially elevated to 140/78, when cuff applied correctly, BPs 130s/70-80s, no severe features. HELLP  labs wnl Given procardia for contractions and percocet for pain. Cervix closed/thick/high and unchanged on recheck after 2 hours of observation   Assessment and Plan  Patient is 28 y.o. E0P2330 [redacted]w[redacted]d reporting contractions and pelvic pain secondary to fall - fetal kick counts reinforced - preterm labor precautions - f/u in [facility] tomorrow AM for scheduled prenatal visit   Lavon Paganini 07/15/2014, 9:01 PM

## 2014-07-16 ENCOUNTER — Ambulatory Visit (INDEPENDENT_AMBULATORY_CARE_PROVIDER_SITE_OTHER): Payer: Medicare Other | Admitting: Obstetrics and Gynecology

## 2014-07-16 ENCOUNTER — Encounter: Payer: Medicare Other | Attending: Obstetrics & Gynecology | Admitting: *Deleted

## 2014-07-16 VITALS — BP 133/83 | HR 88 | Temp 98.1°F | Wt 268.8 lb

## 2014-07-16 DIAGNOSIS — O09213 Supervision of pregnancy with history of pre-term labor, third trimester: Secondary | ICD-10-CM

## 2014-07-16 DIAGNOSIS — O34219 Maternal care for unspecified type scar from previous cesarean delivery: Secondary | ICD-10-CM

## 2014-07-16 DIAGNOSIS — Z3A34 34 weeks gestation of pregnancy: Secondary | ICD-10-CM | POA: Diagnosis not present

## 2014-07-16 DIAGNOSIS — O0933 Supervision of pregnancy with insufficient antenatal care, third trimester: Secondary | ICD-10-CM

## 2014-07-16 DIAGNOSIS — O24419 Gestational diabetes mellitus in pregnancy, unspecified control: Secondary | ICD-10-CM | POA: Insufficient documentation

## 2014-07-16 DIAGNOSIS — O4703 False labor before 37 completed weeks of gestation, third trimester: Secondary | ICD-10-CM

## 2014-07-16 DIAGNOSIS — Z713 Dietary counseling and surveillance: Secondary | ICD-10-CM | POA: Insufficient documentation

## 2014-07-16 DIAGNOSIS — O3421 Maternal care for scar from previous cesarean delivery: Secondary | ICD-10-CM

## 2014-07-16 LAB — POCT URINALYSIS DIP (DEVICE)
Bilirubin Urine: NEGATIVE
GLUCOSE, UA: NEGATIVE mg/dL
HGB URINE DIPSTICK: NEGATIVE
Ketones, ur: NEGATIVE mg/dL
Leukocytes, UA: NEGATIVE
NITRITE: NEGATIVE
Protein, ur: NEGATIVE mg/dL
Specific Gravity, Urine: 1.02 (ref 1.005–1.030)
UROBILINOGEN UA: 1 mg/dL (ref 0.0–1.0)
pH: 7 (ref 5.0–8.0)

## 2014-07-16 MED ORDER — ACCU-CHEK FASTCLIX LANCETS MISC: 1.0000 | Freq: Four times a day (QID) | Status: DC

## 2014-07-16 MED ORDER — GLUCOSE BLOOD VI STRP: ORAL_STRIP | Status: DC

## 2014-07-16 NOTE — Progress Notes (Signed)
Doing well today. Seen in triage last night for contractions, discharged with procardia. Continues to have some contractions today, but less severe.  1. GDM. Not checking blood sugars as she does not have supplies. See diabetic educator to obtain supplies. Follow up one week to review blood sugars.  2. H/o c/s x 4. Needs repeat section at 39 weeks.

## 2014-07-16 NOTE — Progress Notes (Signed)
Felicia Foster has recently moved from [place]. She has been using a [employer] IQ meter which is not covered by her Medicare Disability in [place]. I have dispensed: Accu-Chek Nano Lot: [id number] Exp: 03/03/15 FBS 90mg /dl She notes that her eating pattern is non-traditional. She goes for multiple hours without eating, Snacks and goes back to sleep. She is having trouble with "heartburn and nausea". She is taking Pepcid, not the Protonix nor phenergen.  I have asked that she take her glucose whenever she gets up for the day (3:00pm) and a couple random times during the day. We will review her glucose readings next week.

## 2014-07-16 NOTE — Discharge Instructions (Signed)

## 2014-07-16 NOTE — Progress Notes (Signed)
Reports went to MAU yesterday for contractions-- was given procardia. Reports she is still having contractions today-- reports she has been up for an hour and have had a contraction every 6-68minutes. Denies LOF or bleeding.  Declines flu and tdap.

## 2014-07-16 NOTE — Addendum Note (Signed)
Addended by: Bernie Covey on: 07/16/2014 11:34 AM   Modules accepted: Orders, Medications

## 2014-07-23 ENCOUNTER — Ambulatory Visit (INDEPENDENT_AMBULATORY_CARE_PROVIDER_SITE_OTHER): Payer: Medicare Other | Admitting: Obstetrics and Gynecology

## 2014-07-23 VITALS — BP 135/83 | HR 98 | Temp 98.9°F | Wt 269.6 lb

## 2014-07-23 DIAGNOSIS — O3421 Maternal care for scar from previous cesarean delivery: Secondary | ICD-10-CM

## 2014-07-23 DIAGNOSIS — O99343 Other mental disorders complicating pregnancy, third trimester: Secondary | ICD-10-CM

## 2014-07-23 DIAGNOSIS — O0933 Supervision of pregnancy with insufficient antenatal care, third trimester: Secondary | ICD-10-CM

## 2014-07-23 DIAGNOSIS — F319 Bipolar disorder, unspecified: Secondary | ICD-10-CM

## 2014-07-23 DIAGNOSIS — O34219 Maternal care for unspecified type scar from previous cesarean delivery: Secondary | ICD-10-CM

## 2014-07-23 DIAGNOSIS — O24419 Gestational diabetes mellitus in pregnancy, unspecified control: Secondary | ICD-10-CM

## 2014-07-23 DIAGNOSIS — Z87798 Personal history of other (corrected) congenital malformations: Secondary | ICD-10-CM

## 2014-07-23 LAB — POCT URINALYSIS DIP (DEVICE)
Glucose, UA: NEGATIVE mg/dL
Hgb urine dipstick: NEGATIVE
Ketones, ur: NEGATIVE mg/dL
Nitrite: NEGATIVE
Protein, ur: 30 mg/dL — AB
Specific Gravity, Urine: 1.015 (ref 1.005–1.030)
Urobilinogen, UA: 4 mg/dL — ABNORMAL HIGH (ref 0.0–1.0)
pH: 7 (ref 5.0–8.0)

## 2014-07-23 NOTE — Progress Notes (Signed)
29 y.o. [id number] at [redacted]w[redacted]d. Doing well today.  1. GDM. Provided with supplies for testing last week. Did not bring log today. States AM fasting in the 80s and postprandial 110-120. Discussed diet/exercise. May need to start medications. Growth scan at [redacted]w[redacted]d EFW 2317 gm (71%ile). Needs follow up ultrasound between 36-[redacted] weeks gestation, ordered today.  2. History of c/s x4. Needs repeat section at 39 weeks.  3. Routine PNC. Having braxton hicks contractions. FM/PTL precautions reviewed.

## 2014-07-23 NOTE — Progress Notes (Signed)
Patient reports very intense contractions last night that were very painful

## 2014-07-23 NOTE — Progress Notes (Signed)
Growth U/S 08/01/14 @ 3p.

## 2014-07-30 ENCOUNTER — Encounter: Payer: Medicare Other | Admitting: Family Medicine

## 2014-08-01 ENCOUNTER — Other Ambulatory Visit: Payer: Self-pay | Admitting: Family Medicine

## 2014-08-01 ENCOUNTER — Encounter (HOSPITAL_COMMUNITY): Payer: Self-pay | Admitting: General Practice

## 2014-08-01 ENCOUNTER — Inpatient Hospital Stay (HOSPITAL_COMMUNITY)
Admission: AD | Admit: 2014-08-01 | Discharge: 2014-08-01 | Disposition: A | Discharge status: Home / Self Care | Payer: Medicare Other | Source: Ambulatory Visit | Attending: Obstetrics & Gynecology | Admitting: Obstetrics & Gynecology

## 2014-08-01 ENCOUNTER — Ambulatory Visit (INDEPENDENT_AMBULATORY_CARE_PROVIDER_SITE_OTHER): Payer: Medicare Other | Admitting: Family Medicine

## 2014-08-01 ENCOUNTER — Ambulatory Visit (HOSPITAL_COMMUNITY): Payer: Medicare Other

## 2014-08-01 ENCOUNTER — Other Ambulatory Visit: Payer: Self-pay | Admitting: Advanced Practice Midwife

## 2014-08-01 VITALS — BP 142/92 | HR 87 | Temp 98.8°F | Wt 273.7 lb

## 2014-08-01 DIAGNOSIS — O0933 Supervision of pregnancy with insufficient antenatal care, third trimester: Secondary | ICD-10-CM

## 2014-08-01 DIAGNOSIS — Z87891 Personal history of nicotine dependence: Secondary | ICD-10-CM | POA: Diagnosis not present

## 2014-08-01 DIAGNOSIS — O24410 Gestational diabetes mellitus in pregnancy, diet controlled: Secondary | ICD-10-CM

## 2014-08-01 DIAGNOSIS — O34219 Maternal care for unspecified type scar from previous cesarean delivery: Secondary | ICD-10-CM

## 2014-08-01 DIAGNOSIS — O3421 Maternal care for scar from previous cesarean delivery: Secondary | ICD-10-CM | POA: Insufficient documentation

## 2014-08-01 DIAGNOSIS — Z3A36 36 weeks gestation of pregnancy: Secondary | ICD-10-CM | POA: Insufficient documentation

## 2014-08-01 DIAGNOSIS — O0993 Supervision of high risk pregnancy, unspecified, third trimester: Secondary | ICD-10-CM

## 2014-08-01 DIAGNOSIS — O133 Gestational [pregnancy-induced] hypertension without significant proteinuria, third trimester: Secondary | ICD-10-CM

## 2014-08-01 DIAGNOSIS — O479 False labor, unspecified: Secondary | ICD-10-CM

## 2014-08-01 LAB — URINALYSIS, ROUTINE W REFLEX MICROSCOPIC
Bilirubin Urine: NEGATIVE
GLUCOSE, UA: NEGATIVE mg/dL
HGB URINE DIPSTICK: NEGATIVE
KETONES UR: NEGATIVE mg/dL
Nitrite: NEGATIVE
PH: 7 (ref 5.0–8.0)
Protein, ur: NEGATIVE mg/dL
Specific Gravity, Urine: 1.02 (ref 1.005–1.030)
Urobilinogen, UA: 1 mg/dL (ref 0.0–1.0)

## 2014-08-01 LAB — CBC
HCT: 31.1 % — ABNORMAL LOW (ref 36.0–46.0)
HEMOGLOBIN: 10.2 g/dL — AB (ref 12.0–15.0)
MCH: 24.5 pg — ABNORMAL LOW (ref 26.0–34.0)
MCHC: 32.8 g/dL (ref 30.0–36.0)
MCV: 74.8 fL — ABNORMAL LOW (ref 78.0–100.0)
Platelets: 219 10*3/uL (ref 150–400)
RBC: 4.16 MIL/uL (ref 3.87–5.11)
RDW: 13.4 % (ref 11.5–15.5)
WBC: 5.9 10*3/uL (ref 4.0–10.5)

## 2014-08-01 LAB — COMPREHENSIVE METABOLIC PANEL
ALT: 9 U/L (ref 0–35)
ANION GAP: 8 (ref 5–15)
AST: 12 U/L (ref 0–37)
Albumin: 2.6 g/dL — ABNORMAL LOW (ref 3.5–5.2)
Alkaline Phosphatase: 107 U/L (ref 39–117)
BILIRUBIN TOTAL: 0.4 mg/dL (ref 0.3–1.2)
BUN: 6 mg/dL (ref 6–23)
CALCIUM: 8.7 mg/dL (ref 8.4–10.5)
CHLORIDE: 105 meq/L (ref 96–112)
CO2: 22 mmol/L (ref 19–32)
CREATININE: 0.5 mg/dL (ref 0.50–1.10)
GFR calc non Af Amer: >90 mL/min (ref >90)
Glucose, Bld: 84 mg/dL (ref 70–99)
POTASSIUM: 3.9 mmol/L (ref 3.5–5.1)
Sodium: 135 mmol/L (ref 135–145)
TOTAL PROTEIN: 7.2 g/dL (ref 6.0–8.3)

## 2014-08-01 LAB — PROTEIN / CREATININE RATIO, URINE
Creatinine, Urine: 327 mg/dL
PROTEIN CREATININE RATIO: 0.11 (ref 0.00–0.15)
Total Protein, Urine: 36 mg/dL

## 2014-08-01 LAB — URINE MICROSCOPIC-ADD ON

## 2014-08-01 MED ORDER — BUTALBITAL-APAP-CAFFEINE 50-325-40 MG PO TABS
1.0000 | ORAL_TABLET | Freq: Four times a day (QID) | ORAL | Status: DC | PRN
Start: 2014-08-01 — End: 2014-08-11

## 2014-08-01 NOTE — MAU Provider Note (Signed)
Chief Complaint:  No chief complaint on file.   First Provider Initiated Contact with Patient 08/01/14 1645     HPI: Felicia Foster is a 28 y.o. [id number] at [redacted]w[redacted]d who sent to maternity admissions from [facility] outpatient clinic for preeclampsia evaluation and labor evaluation.  The pressure was elevated for the first time today in clinic. Patient also reports increased contractions, soreness at her old C-section incision and headaches 2 weeks that are partially relieved with Tylenol. No headache now.   Patient attributes her C-section scar soreness to her increasing contractions. After reviewing risk of uterine rupture in patient with prior C-sections patient states she is very certain that this is Denies fever, chills, vision changes, epigastric pain, leakage of fluid or vaginal bleeding. Good fetal movement.   Pregnancy Course: Diet-controlled gestational diabetes, history of C-section 4  Past Medical History: Past Medical History  Diagnosis Date  . Fibroids   . Mental disorder     bipolar  . Diabetes mellitus without complication   . 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 Cesarean section      x 4  . Dilation and curettage of uterus       Family  History: Family History  Problem Relation Age of Onset  . Cancer Mother     Social History: History  Substance Use Topics  . Smoking status: Former Research scientist (life sciences)  . Smokeless tobacco: Never Used  . Alcohol Use: No    Allergies: No Known Allergies  Meds:  Prescriptions prior to admission  Medication Sig Dispense Refill Last Dose  . acetaminophen (TYLENOL) 325 MG tablet Take 650 mg by mouth every 6 (six) hours as needed for headache.   Past Week at Unknown time  . clotrimazole-betamethasone (LOTRISONE) cream Apply 1 application topically 2 (two) times daily. 30 g 0 07/31/2014 at 2200  . famotidine (PEPCID) 10 MG tablet Take 10 mg by mouth daily.    07/31/2014 at 2200  . Prenatal Vit-Fe Fumarate-FA (PRENATAL MULTIVITAMIN) TABS tablet Take 1 tablet by mouth daily at 12 noon.   Past Week at Unknown time  . promethazine (PHENERGAN) 25 MG tablet Take 25 mg by mouth every 6 (six) hours as needed for nausea or vomiting.   Past Week at Unknown time  . ACCU-CHEK FASTCLIX LANCETS  MISC 1 each by Percutaneous route 4 (four) times daily. (Patient not taking: Reported on 08/01/2014) 100 each 12 Not Taking at Unknown time  . glucose blood (ACCU-CHEK ACTIVE STRIPS) test strip Use as instructed (Patient not taking: Reported on 08/01/2014) 1 each 12 Not Taking at Unknown time    ROS: Pertinent positive findings in history of present illness. Neg for fever, chills, vision changes, epigastric pain, leakage of fluid or vaginal bleeding.   Physical Exam  Blood pressure 146/110, pulse 93, resp. rate 18, last menstrual period 11/14/2013.  Patient Vitals for the past 24 hrs:  BP Pulse Resp  08/01/14 1913 139/95 mmHg 90 18  08/01/14 1907 (!) 146/110 mmHg 93 18  08/01/14 1846 130/85 mmHg 97 -  08/01/14 1830 130/85 mmHg 94 -  08/01/14 1816 126/80 mmHg 93 -  08/01/14 1801 131/80 mmHg 92 -  08/01/14 1745 140/88 mmHg 88 -  08/01/14 1731 129/84 mmHg 88 -  08/01/14 1716 133/82 mmHg 89 -  08/01/14 1631 140/85 mmHg  92 -  08/01/14 1616 152/73 mmHg 91 -  08/01/14 1546 145/88 mmHg 91 -  08/01/14 1530 143/93 mmHg 92 -  08/01/14 1516 127/82 mmHg 89 -  08/01/14 1502 123/70 mmHg 97 -  08/01/14 1454 154/93 mmHg 92 -  08/01/14 1451 (!) 154/105 mmHg 96 -  08/01/14 1450 154/93 mmHg 91 18    GENERAL: Well-developed, well-nourished female in no acute distress.  HEENT: normocephalic HEART: normal rate RESP: normal effort ABDOMEN: Soft, non-tender, gravid appropriate for gestational age. Well-healed low transverse incision. NT.  EXTREMITIES: Nontender, 1+ edema NEURO: alert and oriented SPECULUM EXAM: Deferred   Cervix closed and long.  FHT:  Baseline 145 , moderate variability, accelerations present, no decelerations Contractions: q 2-8 mins, mild-mod   Labs: Results for orders placed or performed during the hospital encounter of 08/01/14 (from the past 24 hour(s))  CBC     Status: Abnormal   Collection Time: 08/01/14  4:13 PM  Result Value Ref Range   WBC 5.9 4.0 - 10.5 K/uL   RBC 4.16 3.87 - 5.11 MIL/uL   Hemoglobin 10.2 (L) 12.0 - 15.0 g/dL   HCT 31.1 (L) 36.0 - 46.0 %   MCV 74.8 (L) 78.0 - 100.0 fL   MCH 24.5 (L) 26.0 - 34.0 pg   MCHC 32.8 30.0 - 36.0 g/dL   RDW 13.4 11.5 - 15.5 %   Platelets 219 150 - 400 K/uL  Comprehensive metabolic panel     Status: Abnormal   Collection Time: 08/01/14  4:13 PM  Result Value Ref Range   Sodium 135 135 - 145 mmol/L   Potassium 3.9 3.5 - 5.1 mmol/L   Chloride 105 96 - 112 mEq/L   CO2 22 19 - 32 mmol/L   Glucose, Bld 84 70 - 99 mg/dL   BUN 6 6 - 23 mg/dL   Creatinine, Ser 0.50 0.50 - 1.10 mg/dL   Calcium 8.7 8.4 - 10.5 mg/dL   Total Protein 7.2 6.0 - 8.3 g/dL   Albumin 2.6 (L) 3.5 - 5.2 g/dL   AST 12 0 - 37 U/L   ALT 9 0 - 35 U/L   Alkaline Phosphatase 107 39 - 117 U/L   Total Bilirubin 0.4 0.3 - 1.2 mg/dL   GFR calc non Af Amer >90 >90 mL/min   GFR calc Af Amer >90 >90 mL/min   Anion gap 8 5 - 15  Protein / creatinine ratio, urine     Status:  None  Collection Time: 08/01/14  4:45 PM  Result Value Ref Range   Creatinine, Urine 327.00 mg/dL   Total Protein, Urine 36 mg/dL   Protein Creatinine Ratio 0.11 0.00 - 0.15  Urinalysis, Routine w reflex microscopic     Status: Abnormal   Collection Time: 08/01/14  4:45 PM  Result Value Ref Range   Color, Urine AMBER (A) YELLOW   APPearance CLEAR CLEAR   Specific Gravity, Urine 1.020 1.005 - 1.030   pH 7.0 5.0 - 8.0   Glucose, UA NEGATIVE NEGATIVE mg/dL   Hgb urine dipstick NEGATIVE NEGATIVE   Bilirubin Urine NEGATIVE NEGATIVE   Ketones, ur NEGATIVE NEGATIVE mg/dL   Protein, ur NEGATIVE NEGATIVE mg/dL   Urobilinogen, UA 1.0 0.0 - 1.0 mg/dL   Nitrite NEGATIVE NEGATIVE   Leukocytes, UA TRACE (A) NEGATIVE  Urine microscopic-add on     Status: Abnormal   Collection Time: 08/01/14  4:45 PM  Result Value Ref Range   Squamous Epithelial / LPF MANY (A) RARE   WBC, UA 7-10 <3 WBC/hpf   RBC / HPF 0-2 <3 RBC/hpf   Bacteria, UA MANY (A) RARE   Urine-Other MUCOUS PRESENT     Imaging:    MAU Course: Discussed BP's, incision soreness, HA w/ Dr. Ihor Dow. Low suspicion for Pre-E or uterine rupture.   Assessment: 1. Gestational hypertension w/o significant proteinuria in 3rd trimester   2. History of cesarean delivery, antepartum   3. Braxton Hick's contraction     Plan: Discharge home in stable condition per consult with Dr. Ihor Dow Labor precautions, pre--eclampsia precautions and fetal kick counts Needs to start antenatal testing. In-basket message sent to clinic.  Already has C/S scheduled for 39 weeks.       Follow-up Information    Follow up with [facility] On 08/09/2014.   Specialty:  Obstetrics and Gynecology   Contact information:   [place] [phone]      Follow up with [facility].   Specialty:  Maternal and Fetal Medicine   Why:  will call you to schedule growth ultrasound  and BPP early next week    Contact information:   [address] [id number] [place] [phone]      Follow up with [place].   Why:  As needed in emergencies   Contact information:   [address] [id number] [place] [phone]       Medication List    TAKE these medications        ACCU-CHEK FASTCLIX LANCETS Misc  1 each by Percutaneous route 4 (four) times daily.     acetaminophen 325 MG tablet  Commonly known as:  TYLENOL  Take 650 mg by mouth every 6 (six) hours as needed for headache.     butalbital-acetaminophen-caffeine 50-325-40 MG per tablet  Commonly known as:  FIORICET  Take 1-2 tablets by mouth every 6 (six) hours as needed for headache.     clotrimazole-betamethasone cream  Commonly known as:  LOTRISONE  Apply 1 application topically 2 (two) times daily.     famotidine 10 MG tablet  Commonly known as:  PEPCID  Take 10 mg by mouth daily.     glucose blood test strip  Commonly known as:  ACCU-CHEK ACTIVE STRIPS  Use as instructed     prenatal multivitamin Tabs tablet  Take 1 tablet by mouth daily at 12 noon.  promethazine 25 MG tablet  Commonly known as:  PHENERGAN  Take 25 mg by mouth every 6 (six) hours as needed for nausea or vomiting.        [place], [place] 08/01/2014 7:08 PM

## 2014-08-01 NOTE — Progress Notes (Signed)
Patient has not been checking blood sugars over past week due to christmas.  States that she will start checking again.

## 2014-08-01 NOTE — Progress Notes (Signed)
Pt reports not checking her BS x 1 week  Edema-feet

## 2014-08-01 NOTE — Progress Notes (Signed)
Having some braxton hicks contractions.  Has f/u US today.  Elevated BP - sent to MAU for labs, prolonged monitoring.

## 2014-08-01 NOTE — Progress Notes (Signed)
Felicia Foster in for discharge instruction, will retake with pt in semi F position

## 2014-08-01 NOTE — Progress Notes (Signed)
This BP taken with clothes on as well as the previous BP, other BP's were not filed at time this RN went in to discharge pt

## 2014-08-01 NOTE — Patient Instructions (Signed)
Third Trimester of Pregnancy The third trimester is from week 29 through week 42, months 7 through 9. The third trimester is a time when the fetus is growing rapidly. At the end of the ninth month, the fetus is about 20 inches in length and weighs 6-10 pounds.  BODY CHANGES Your body goes through many changes during pregnancy. The changes vary from woman to woman.   Your weight will continue to increase. You can expect to gain 25-35 pounds (11-16 kg) by the end of the pregnancy.  You may begin to get stretch marks on your hips, abdomen, and breasts.  You may urinate more often because the fetus is moving lower into your pelvis and pressing on your bladder.  You may develop or continue to have heartburn as a result of your pregnancy.  You may develop constipation because certain hormones are causing the muscles that push waste through your intestines to slow down.  You may develop hemorrhoids or swollen, bulging veins (varicose veins).  You may have pelvic pain because of the weight gain and pregnancy hormones relaxing your joints between the bones in your pelvis. Backaches may result from overexertion of the muscles supporting your posture.  You may have changes in your hair. These can include thickening of your hair, rapid growth, and changes in texture. Some women also have hair loss during or after pregnancy, or hair that feels dry or thin. Your hair will most likely return to normal after your baby is born.  Your breasts will continue to grow and be tender. A yellow discharge may leak from your breasts called colostrum.  Your belly button may stick out.  You may feel short of breath because of your expanding uterus.  You may notice the fetus "dropping," or moving lower in your abdomen.  You may have a bloody mucus discharge. This usually occurs a few days to a week before labor begins.  Your cervix becomes thin and soft (effaced) near your due date. WHAT TO EXPECT AT YOUR PRENATAL  EXAMS  You will have prenatal exams every 2 weeks until week 36. Then, you will have weekly prenatal exams. During a routine prenatal visit:  You will be weighed to make sure you and the fetus are growing normally.  Your blood pressure is taken.  Your abdomen will be measured to track your baby's growth.  The fetal heartbeat will be listened to.  Any test results from the previous visit will be discussed.  You may have a cervical check near your due date to see if you have effaced. At around 36 weeks, your caregiver will check your cervix. At the same time, your caregiver will also perform a test on the secretions of the vaginal tissue. This test is to determine if a type of bacteria, Group B streptococcus, is present. Your caregiver will explain this further. Your caregiver may ask you:  What your birth plan is.  How you are feeling.  If you are feeling the baby move.  If you have had any abnormal symptoms, such as leaking fluid, bleeding, severe headaches, or abdominal cramping.  If you have any questions. Other tests or screenings that may be performed during your third trimester include:  Blood tests that check for low iron levels (anemia).  Fetal testing to check the health, activity level, and growth of the fetus. Testing is done if you have certain medical conditions or if there are problems during the pregnancy. FALSE LABOR You may feel small, irregular contractions that   eventually go away. These are called Braxton Hicks contractions, or false labor. Contractions may last for hours, days, or even weeks before true labor sets in. If contractions come at regular intervals, intensify, or become painful, it is best to be seen by your caregiver.  SIGNS OF LABOR   Menstrual-like cramps.  Contractions that are 5 minutes apart or less.  Contractions that start on the top of the uterus and spread down to the lower abdomen and back.  A sense of increased pelvic pressure or back  pain.  A watery or bloody mucus discharge that comes from the vagina. If you have any of these signs before the 37th week of pregnancy, call your caregiver right away. You need to go to the hospital to get checked immediately. HOME CARE INSTRUCTIONS   Avoid all smoking, herbs, alcohol, and unprescribed drugs. These chemicals affect the formation and growth of the baby.  Follow your caregiver's instructions regarding medicine use. There are medicines that are either safe or unsafe to take during pregnancy.  Exercise only as directed by your caregiver. Experiencing uterine cramps is a good sign to stop exercising.  Continue to eat regular, healthy meals.  Wear a good support bra for breast tenderness.  Do not use hot tubs, steam rooms, or saunas.  Wear your seat belt at all times when driving.  Avoid raw meat, uncooked cheese, cat litter boxes, and soil used by cats. These carry germs that can cause birth defects in the baby.  Take your prenatal vitamins.  Try taking a stool softener (if your caregiver approves) if you develop constipation. Eat more high-fiber foods, such as fresh vegetables or fruit and whole grains. Drink plenty of fluids to keep your urine clear or pale yellow.  Take warm sitz baths to soothe any pain or discomfort caused by hemorrhoids. Use hemorrhoid cream if your caregiver approves.  If you develop varicose veins, wear support hose. Elevate your feet for 15 minutes, 3-4 times a day. Limit salt in your diet.  Avoid heavy lifting, wear low heal shoes, and practice good posture.  Rest a lot with your legs elevated if you have leg cramps or low back pain.  Visit your dentist if you have not gone during your pregnancy. Use a soft toothbrush to brush your teeth and be gentle when you floss.  A sexual relationship may be continued unless your caregiver directs you otherwise.  Do not travel far distances unless it is absolutely necessary and only with the approval  of your caregiver.  Take prenatal classes to understand, practice, and ask questions about the labor and delivery.  Make a trial run to the hospital.  Pack your hospital bag.  Prepare the baby's nursery.  Continue to go to all your prenatal visits as directed by your caregiver. SEEK MEDICAL CARE IF:  You are unsure if you are in labor or if your water has broken.  You have dizziness.  You have mild pelvic cramps, pelvic pressure, or nagging pain in your abdominal area.  You have persistent nausea, vomiting, or diarrhea.  You have a bad smelling vaginal discharge.  You have pain with urination. SEEK IMMEDIATE MEDICAL CARE IF:   You have a fever.  You are leaking fluid from your vagina.  You have spotting or bleeding from your vagina.  You have severe abdominal cramping or pain.  You have rapid weight loss or gain.  You have shortness of breath with chest pain.  You notice sudden or extreme swelling   of your face, hands, ankles, feet, or legs.  You have not felt your baby move in over an hour.  You have severe headaches that do not go away with medicine.  You have vision changes. Document Released: 07/14/2001 Document Revised: 07/25/2013 Document Reviewed: 09/20/2012 [employer] Patient Information 2015 [employer]. This information is not intended to replace advice given to you by your health care provider. Make sure you discuss any questions you have with your health care provider.  

## 2014-08-01 NOTE — Discharge Instructions (Signed)
Braxton Hicks Contractions °Contractions of the uterus can occur throughout pregnancy. Contractions are not always a sign that you are in labor.  °WHAT ARE BRAXTON HICKS CONTRACTIONS?  °Contractions that occur before labor are called Braxton Hicks contractions, or false labor. Toward the end of pregnancy (32-34 weeks), these contractions can develop more often and may become more forceful. This is not true labor because these contractions do not result in opening (dilatation) and thinning of the cervix. They are sometimes difficult to tell apart from true labor because these contractions can be forceful and people have different pain tolerances. You should not feel embarrassed if you go to the hospital with false labor. Sometimes, the only way to tell if you are in true labor is for your health care provider to look for changes in the cervix. °If there are no prenatal problems or other health problems associated with the pregnancy, it is completely safe to be sent home with false labor and await the onset of true labor. °HOW CAN YOU TELL THE DIFFERENCE BETWEEN TRUE AND FALSE LABOR? °False Labor °· The contractions of false labor are usually shorter and not as hard as those of true labor.   °· The contractions are usually irregular.   °· The contractions are often felt in the front of the lower abdomen and in the groin.   °· The contractions may go away when you walk around or change positions while lying down.   °· The contractions get weaker and are shorter lasting as time goes on.   °· The contractions do not usually become progressively stronger, regular, and closer together as with true labor.   °True Labor °1. Contractions in true labor last 30-70 seconds, become very regular, usually become more intense, and increase in frequency.   °2. The contractions do not go away with walking.   °3. The discomfort is usually felt in the top of the uterus and spreads to the lower abdomen and low back.   °4. True labor can  be determined by your health care provider with an exam. This will show that the cervix is dilating and getting thinner.   °WHAT TO REMEMBER °· Keep up with your usual exercises and follow other instructions given by your health care provider.   °· Take medicines as directed by your health care provider.   °· Keep your regular prenatal appointments.   °· Eat and drink lightly if you think you are going into labor.   °· If Braxton Hicks contractions are making you uncomfortable:   °· Change your position from lying down or resting to walking, or from walking to resting.   °· Sit and rest in a tub of warm water.   °· Drink 2-3 glasses of water. Dehydration may cause these contractions.   °· Do slow and deep breathing several times an hour.   °WHEN SHOULD I SEEK IMMEDIATE MEDICAL CARE? °Seek immediate medical care if: °· Your contractions become stronger, more regular, and closer together.   °· You have fluid leaking or gushing from your vagina.   °· You have a fever.   °· You pass blood-tinged mucus.   °· You have vaginal bleeding.   °· You have continuous abdominal pain.   °· You have low back pain that you never had before.   °· You feel your baby's head pushing down and causing pelvic pressure.   °· Your baby is not moving as much as it used to.   °Document Released: 07/20/2005 Document Revised: 07/25/2013 Document Reviewed: 05/01/2013 °[employer]® Patient Information ©2015 [employer]. This information is not intended to replace advice given to you by your health care   provider. Make sure you discuss any questions you have with your health care provider. ° °Fetal Movement Counts °Patient Name: __________________________________________________ Patient Due Date: ____________________ °Performing a fetal movement count is highly recommended in high-risk pregnancies, but it is good for every pregnant woman to do. Your health care provider may ask you to start counting fetal movements at 28 weeks of the pregnancy. Fetal  movements often increase: °· After eating a full meal. °· After physical activity. °· After eating or drinking something sweet or cold. °· At rest. °Pay attention to when you feel the baby is most active. This will help you notice a pattern of your baby's sleep and wake cycles and what factors contribute to an increase in fetal movement. It is important to perform a fetal movement count at the same time each day when your baby is normally most active.  °HOW TO COUNT FETAL MOVEMENTS °5. Find a quiet and comfortable area to sit or lie down on your left side. Lying on your left side provides the best blood and oxygen circulation to your baby. °6. Write down the day and time on a sheet of paper or in a journal. °7. Start counting kicks, flutters, swishes, rolls, or jabs in a 2-hour period. You should feel at least 10 movements within 2 hours. °8. If you do not feel 10 movements in 2 hours, wait 2-3 hours and count again. Look for a change in the pattern or not enough counts in 2 hours. °SEEK MEDICAL CARE IF: °· You feel less than 10 counts in 2 hours, tried twice. °· There is no movement in over an hour. °· The pattern is changing or taking longer each day to reach 10 counts in 2 hours. °· You feel the baby is not moving as he or she usually does. °Date: ____________ Movements: ____________ Start time: ____________ Finish time: ____________  °Date: ____________ Movements: ____________ Start time: ____________ Finish time: ____________ °Date: ____________ Movements: ____________ Start time: ____________ Finish time: ____________ °Date: ____________ Movements: ____________ Start time: ____________ Finish time: ____________ °Date: ____________ Movements: ____________ Start time: ____________ Finish time: ____________ °Date: ____________ Movements: ____________ Start time: ____________ Finish time: ____________ °Date: ____________ Movements: ____________ Start time: ____________ Finish time: ____________ °Date: ____________  Movements: ____________ Start time: ____________ Finish time: ____________  °Date: ____________ Movements: ____________ Start time: ____________ Finish time: ____________ °Date: ____________ Movements: ____________ Start time: ____________ Finish time: ____________ °Date: ____________ Movements: ____________ Start time: ____________ Finish time: ____________ °Date: ____________ Movements: ____________ Start time: ____________ Finish time: ____________ °Date: ____________ Movements: ____________ Start time: ____________ Finish time: ____________ °Date: ____________ Movements: ____________ Start time: ____________ Finish time: ____________ °Date: ____________ Movements: ____________ Start time: ____________ Finish time: ____________  °Date: ____________ Movements: ____________ Start time: ____________ Finish time: ____________ °Date: ____________ Movements: ____________ Start time: ____________ Finish time: ____________ °Date: ____________ Movements: ____________ Start time: ____________ Finish time: ____________ °Date: ____________ Movements: ____________ Start time: ____________ Finish time: ____________ °Date: ____________ Movements: ____________ Start time: ____________ Finish time: ____________ °Date: ____________ Movements: ____________ Start time: ____________ Finish time: ____________ °Date: ____________ Movements: ____________ Start time: ____________ Finish time: ____________  °Date: ____________ Movements: ____________ Start time: ____________ Finish time: ____________ °Date: ____________ Movements: ____________ Start time: ____________ Finish time: ____________ °Date: ____________ Movements: ____________ Start time: ____________ Finish time: ____________ °Date: ____________ Movements: ____________ Start time: ____________ Finish time: ____________ °Date: ____________ Movements: ____________ Start time: ____________ Finish time: ____________ °Date: ____________ Movements: ____________ Start time:  ____________ Finish time: ____________ °Date: ____________ Movements:   ____________ Start time: ____________ Finish time: ____________  °Date: ____________ Movements: ____________ Start time: ____________ Finish time: ____________ °Date: ____________ Movements: ____________ Start time: ____________ Finish time: ____________ °Date: ____________ Movements: ____________ Start time: ____________ Finish time: ____________ °Date: ____________ Movements: ____________ Start time: ____________ Finish time: ____________ °Date: ____________ Movements: ____________ Start time: ____________ Finish time: ____________ °Date: ____________ Movements: ____________ Start time: ____________ Finish time: ____________ °Date: ____________ Movements: ____________ Start time: ____________ Finish time: ____________  °Date: ____________ Movements: ____________ Start time: ____________ Finish time: ____________ °Date: ____________ Movements: ____________ Start time: ____________ Finish time: ____________ °Date: ____________ Movements: ____________ Start time: ____________ Finish time: ____________ °Date: ____________ Movements: ____________ Start time: ____________ Finish time: ____________ °Date: ____________ Movements: ____________ Start time: ____________ Finish time: ____________ °Date: ____________ Movements: ____________ Start time: ____________ Finish time: ____________ °Date: ____________ Movements: ____________ Start time: ____________ Finish time: ____________  °Date: ____________ Movements: ____________ Start time: ____________ Finish time: ____________ °Date: ____________ Movements: ____________ Start time: ____________ Finish time: ____________ °Date: ____________ Movements: ____________ Start time: ____________ Finish time: ____________ °Date: ____________ Movements: ____________ Start time: ____________ Finish time: ____________ °Date: ____________ Movements: ____________ Start time: ____________ Finish time: ____________ °Date:  ____________ Movements: ____________ Start time: ____________ Finish time: ____________ °Date: ____________ Movements: ____________ Start time: ____________ Finish time: ____________  °Date: ____________ Movements: ____________ Start time: ____________ Finish time: ____________ °Date: ____________ Movements: ____________ Start time: ____________ Finish time: ____________ °Date: ____________ Movements: ____________ Start time: ____________ Finish time: ____________ °Date: ____________ Movements: ____________ Start time: ____________ Finish time: ____________ °Date: ____________ Movements: ____________ Start time: ____________ Finish time: ____________ °Date: ____________ Movements: ____________ Start time: ____________ Finish time: ____________ °Document Released: 08/19/2006 Document Revised: 12/04/2013 Document Reviewed: 05/16/2012 °[employer]® Patient Information ©2015 [employer]. This information is not intended to replace advice given to you by your health care provider. Make sure you discuss any questions you have with your health care provider. ° °

## 2014-08-02 ENCOUNTER — Telehealth: Payer: Self-pay | Admitting: *Deleted

## 2014-08-02 ENCOUNTER — Other Ambulatory Visit: Payer: Self-pay | Admitting: Family

## 2014-08-02 DIAGNOSIS — A599 Trichomoniasis, unspecified: Secondary | ICD-10-CM

## 2014-08-02 LAB — GC/CHLAMYDIA PROBE AMP
CT Probe RNA: NEGATIVE
GC Probe RNA: NEGATIVE

## 2014-08-02 MED ORDER — METRONIDAZOLE 500 MG PO TABS: 2000.0000 mg | ORAL_TABLET | Freq: Once | ORAL | Status: DC

## 2014-08-02 NOTE — Telephone Encounter (Signed)
Received a message from registar that patient calling about an infection that she saw on My Chart. Called Felicia Foster and she states on her urine done in MAU she sees a comment that says trichomonas. Reviewed patient chart and results and verified trichimonas present on microscopic urinalysis.  Informed patient that yes , she does have trichomonas and needs treatment and that her partner also needs treatment. Informed her I would send a prescription to her pharmacy and that her partner needs to go to his doctor or call health department to get treated and that they should not have sexual contact until both treated x 2 weeks.  Felicia Foster wanted to know when she got this. I informed her I was unable to tell her when she got it,but that she did get it from another person.  Felicia Foster voices understanding.

## 2014-08-04 LAB — CULTURE, BETA STREP (GROUP B ONLY)

## 2014-08-08 ENCOUNTER — Encounter (HOSPITAL_COMMUNITY): Payer: Self-pay | Admitting: *Deleted

## 2014-08-08 ENCOUNTER — Inpatient Hospital Stay (HOSPITAL_COMMUNITY)
Admission: AD | Admit: 2014-08-08 | Discharge: 2014-08-11 | DRG: 765 | Disposition: A | Discharge status: Home / Self Care | Payer: Medicare Other | Source: Ambulatory Visit | Attending: Family Medicine | Admitting: Family Medicine

## 2014-08-08 ENCOUNTER — Inpatient Hospital Stay (HOSPITAL_COMMUNITY): Payer: Medicare Other | Admitting: Anesthesiology

## 2014-08-08 ENCOUNTER — Encounter (HOSPITAL_COMMUNITY)
Admission: AD | Disposition: A | Discharge status: Home / Self Care | Payer: Self-pay | Source: Ambulatory Visit | Attending: Family Medicine

## 2014-08-08 DIAGNOSIS — Z720 Tobacco use: Secondary | ICD-10-CM | POA: Diagnosis not present

## 2014-08-08 DIAGNOSIS — F3189 Other bipolar disorder: Secondary | ICD-10-CM | POA: Diagnosis present

## 2014-08-08 DIAGNOSIS — O99214 Obesity complicating childbirth: Secondary | ICD-10-CM | POA: Diagnosis present

## 2014-08-08 DIAGNOSIS — O24429 Gestational diabetes mellitus in childbirth, unspecified control: Secondary | ICD-10-CM

## 2014-08-08 DIAGNOSIS — O24419 Gestational diabetes mellitus in pregnancy, unspecified control: Secondary | ICD-10-CM | POA: Diagnosis present

## 2014-08-08 DIAGNOSIS — Z6841 Body Mass Index (BMI) 40.0 and over, adult: Secondary | ICD-10-CM

## 2014-08-08 DIAGNOSIS — Z98891 History of uterine scar from previous surgery: Secondary | ICD-10-CM

## 2014-08-08 DIAGNOSIS — O3421 Maternal care for scar from previous cesarean delivery: Secondary | ICD-10-CM | POA: Diagnosis not present

## 2014-08-08 DIAGNOSIS — Z3A37 37 weeks gestation of pregnancy: Secondary | ICD-10-CM | POA: Diagnosis present

## 2014-08-08 DIAGNOSIS — O09213 Supervision of pregnancy with history of pre-term labor, third trimester: Secondary | ICD-10-CM

## 2014-08-08 DIAGNOSIS — O09293 Supervision of pregnancy with other poor reproductive or obstetric history, third trimester: Secondary | ICD-10-CM | POA: Diagnosis not present

## 2014-08-08 DIAGNOSIS — O0933 Supervision of pregnancy with insufficient antenatal care, third trimester: Secondary | ICD-10-CM | POA: Diagnosis not present

## 2014-08-08 DIAGNOSIS — O34219 Maternal care for unspecified type scar from previous cesarean delivery: Secondary | ICD-10-CM

## 2014-08-08 DIAGNOSIS — O133 Gestational [pregnancy-induced] hypertension without significant proteinuria, third trimester: Principal | ICD-10-CM | POA: Diagnosis present

## 2014-08-08 DIAGNOSIS — O352XX1 Maternal care for (suspected) hereditary disease in fetus, fetus 1: Secondary | ICD-10-CM

## 2014-08-08 LAB — CBC
HEMATOCRIT: 30.7 % — AB (ref 36.0–46.0)
Hemoglobin: 10 g/dL — ABNORMAL LOW (ref 12.0–15.0)
MCH: 24.2 pg — AB (ref 26.0–34.0)
MCHC: 32.6 g/dL (ref 30.0–36.0)
MCV: 74.2 fL — ABNORMAL LOW (ref 78.0–100.0)
PLATELETS: 229 10*3/uL (ref 150–400)
RBC: 4.14 MIL/uL (ref 3.87–5.11)
RDW: 13.4 % (ref 11.5–15.5)
WBC: 7.1 10*3/uL (ref 4.0–10.5)

## 2014-08-08 LAB — TYPE AND SCREEN
ABO/RH(D): O POS
ANTIBODY SCREEN: NEGATIVE

## 2014-08-08 LAB — COMPREHENSIVE METABOLIC PANEL
ALT: 10 U/L (ref 0–35)
ANION GAP: 8 (ref 5–15)
AST: 12 U/L (ref 0–37)
Albumin: 2.6 g/dL — ABNORMAL LOW (ref 3.5–5.2)
Alkaline Phosphatase: 109 U/L (ref 39–117)
BILIRUBIN TOTAL: 0.2 mg/dL — AB (ref 0.3–1.2)
BUN: 12 mg/dL (ref 6–23)
CO2: 21 mmol/L (ref 19–32)
Calcium: 9 mg/dL (ref 8.4–10.5)
Chloride: 107 mEq/L (ref 96–112)
Creatinine, Ser: 0.53 mg/dL (ref 0.50–1.10)
GFR calc Af Amer: >90 mL/min (ref >90)
GLUCOSE: 104 mg/dL — AB (ref 70–99)
POTASSIUM: 3.9 mmol/L (ref 3.5–5.1)
SODIUM: 136 mmol/L (ref 135–145)
TOTAL PROTEIN: 6.9 g/dL (ref 6.0–8.3)

## 2014-08-08 LAB — PROTEIN / CREATININE RATIO, URINE
CREATININE, URINE: 182 mg/dL
Protein Creatinine Ratio: 0.14 (ref 0.00–0.15)
TOTAL PROTEIN, URINE: 25 mg/dL

## 2014-08-08 LAB — GLUCOSE, CAPILLARY: Glucose-Capillary: 93 mg/dL (ref 70–99)

## 2014-08-08 LAB — ABO/RH: ABO/RH(D): O POS

## 2014-08-08 SURGERY — Surgical Case
Anesthesia: Epidural

## 2014-08-08 MED ORDER — SCOPOLAMINE 1 MG/3DAYS TD PT72
MEDICATED_PATCH | TRANSDERMAL | Status: AC
  Filled 2014-08-08: qty 1

## 2014-08-08 MED ORDER — OXYTOCIN 40 UNITS IN LACTATED RINGERS INFUSION - SIMPLE MED: 62.5000 mL/h | INTRAVENOUS | Status: AC

## 2014-08-08 MED ORDER — LACTATED RINGERS IV SOLN
INTRAVENOUS | Status: DC | PRN
  Administered 2014-08-08 (×2): via INTRAVENOUS

## 2014-08-08 MED ORDER — ONDANSETRON HCL 4 MG/2ML IJ SOLN: 4.0000 mg | INTRAMUSCULAR | Status: DC | PRN

## 2014-08-08 MED ORDER — DIPHENHYDRAMINE HCL 25 MG PO CAPS
25.0000 mg | ORAL_CAPSULE | Freq: Four times a day (QID) | ORAL | Status: DC | PRN
  Administered 2014-08-08 – 2014-08-09 (×2): 25 mg via ORAL
  Filled 2014-08-08 (×2): qty 1

## 2014-08-08 MED ORDER — PRENATAL MULTIVITAMIN CH
1.0000 | ORAL_TABLET | Freq: Every day | ORAL | Status: DC
  Administered 2014-08-09 – 2014-08-11 (×3): 1 via ORAL
  Filled 2014-08-08 (×3): qty 1

## 2014-08-08 MED ORDER — DIBUCAINE 1 % RE OINT: 1.0000 "application " | TOPICAL_OINTMENT | RECTAL | Status: DC | PRN

## 2014-08-08 MED ORDER — SENNOSIDES-DOCUSATE SODIUM 8.6-50 MG PO TABS
2.0000 | ORAL_TABLET | ORAL | Status: DC
  Administered 2014-08-08 – 2014-08-11 (×3): 2 via ORAL
  Filled 2014-08-08 (×3): qty 2

## 2014-08-08 MED ORDER — KETOROLAC TROMETHAMINE 30 MG/ML IJ SOLN: 30.0000 mg | Freq: Four times a day (QID) | INTRAMUSCULAR | Status: AC | PRN

## 2014-08-08 MED ORDER — FENTANYL CITRATE 0.05 MG/ML IJ SOLN
INTRAMUSCULAR | Status: AC
  Administered 2014-08-08: 25 ug via INTRAVENOUS
  Filled 2014-08-08: qty 2

## 2014-08-08 MED ORDER — ONDANSETRON HCL 4 MG/2ML IJ SOLN
INTRAMUSCULAR | Status: AC
  Filled 2014-08-08: qty 2

## 2014-08-08 MED ORDER — MISOPROSTOL 200 MCG PO TABS
ORAL_TABLET | ORAL | Status: AC
  Filled 2014-08-08: qty 5

## 2014-08-08 MED ORDER — CLOTRIMAZOLE 1 % EX CREA
TOPICAL_CREAM | Freq: Two times a day (BID) | CUTANEOUS | Status: DC
  Administered 2014-08-08 – 2014-08-11 (×6): via TOPICAL
  Filled 2014-08-08 (×2): qty 15

## 2014-08-08 MED ORDER — LACTATED RINGERS IV SOLN
INTRAVENOUS | Status: DC
  Administered 2014-08-09: 125 mL via INTRAVENOUS

## 2014-08-08 MED ORDER — MIDAZOLAM HCL 2 MG/2ML IJ SOLN
INTRAMUSCULAR | Status: AC
  Filled 2014-08-08: qty 2

## 2014-08-08 MED ORDER — FAMOTIDINE 20 MG PO TABS
10.0000 mg | ORAL_TABLET | Freq: Every day | ORAL | Status: DC
  Administered 2014-08-09: 10 mg via ORAL
  Filled 2014-08-08: qty 1

## 2014-08-08 MED ORDER — ACETAMINOPHEN 160 MG/5ML PO SOLN: 325.0000 mg | ORAL | Status: DC | PRN

## 2014-08-08 MED ORDER — FENTANYL CITRATE 0.05 MG/ML IJ SOLN
INTRAMUSCULAR | Status: AC
  Administered 2014-08-08: 50 ug via INTRAVENOUS
  Filled 2014-08-08: qty 2

## 2014-08-08 MED ORDER — MEPERIDINE HCL 25 MG/ML IJ SOLN
INTRAMUSCULAR | Status: AC
  Filled 2014-08-08: qty 1

## 2014-08-08 MED ORDER — FENTANYL CITRATE 0.05 MG/ML IJ SOLN
INTRAMUSCULAR | Status: DC | PRN
  Administered 2014-08-08: 25 ug via INTRAVENOUS

## 2014-08-08 MED ORDER — OXYTOCIN 10 UNIT/ML IJ SOLN
INTRAMUSCULAR | Status: AC
  Filled 2014-08-08: qty 4

## 2014-08-08 MED ORDER — SIMETHICONE 80 MG PO CHEW
80.0000 mg | CHEWABLE_TABLET | ORAL | Status: DC
  Administered 2014-08-08 – 2014-08-11 (×3): 80 mg via ORAL
  Filled 2014-08-08 (×4): qty 1

## 2014-08-08 MED ORDER — BUTALBITAL-APAP-CAFFEINE 50-325-40 MG PO TABS
1.0000 | ORAL_TABLET | Freq: Once | ORAL | Status: AC
  Administered 2014-08-08: 1 via ORAL
  Filled 2014-08-08: qty 1

## 2014-08-08 MED ORDER — OXYTOCIN 10 UNIT/ML IJ SOLN
40.0000 [IU] | INTRAVENOUS | Status: DC | PRN
  Administered 2014-08-08: 40 [IU] via INTRAVENOUS

## 2014-08-08 MED ORDER — LIDOCAINE-EPINEPHRINE (PF) 2 %-1:200000 IJ SOLN
INTRAMUSCULAR | Status: DC | PRN
  Administered 2014-08-08: 3 mL via INTRADERMAL
  Administered 2014-08-08: 2 mL via INTRADERMAL
  Administered 2014-08-08: 5 mL via INTRADERMAL
  Administered 2014-08-08: 10 mL via INTRADERMAL
  Administered 2014-08-08 (×2): 5 mL via INTRADERMAL

## 2014-08-08 MED ORDER — MORPHINE SULFATE (PF) 0.5 MG/ML IJ SOLN
INTRAMUSCULAR | Status: DC | PRN
  Administered 2014-08-08: 3 mg via EPIDURAL

## 2014-08-08 MED ORDER — PHENYLEPHRINE 8 MG IN D5W 100 ML (0.08MG/ML) PREMIX OPTIME
INJECTION | INTRAVENOUS | Status: AC
  Filled 2014-08-08: qty 100

## 2014-08-08 MED ORDER — TETANUS-DIPHTH-ACELL PERTUSSIS 5-2.5-18.5 LF-MCG/0.5 IM SUSP: 0.5000 mL | Freq: Once | INTRAMUSCULAR | Status: DC

## 2014-08-08 MED ORDER — MISOPROSTOL 100 MCG PO TABS
ORAL_TABLET | ORAL | Status: DC | PRN
  Administered 2014-08-08: 1000 ug via RECTAL

## 2014-08-08 MED ORDER — MEPERIDINE HCL 25 MG/ML IJ SOLN
INTRAMUSCULAR | Status: DC | PRN
  Administered 2014-08-08 (×2): 12.5 mg via INTRAVENOUS

## 2014-08-08 MED ORDER — MORPHINE SULFATE 0.5 MG/ML IJ SOLN
INTRAMUSCULAR | Status: AC
  Filled 2014-08-08: qty 10

## 2014-08-08 MED ORDER — NICARDIPINE HCL IN NACL 20-0.86 MG/200ML-% IV SOLN
INTRAVENOUS | Status: AC
  Filled 2014-08-08: qty 200

## 2014-08-08 MED ORDER — SIMETHICONE 80 MG PO CHEW
80.0000 mg | CHEWABLE_TABLET | ORAL | Status: DC | PRN
  Administered 2014-08-09: 80 mg via ORAL

## 2014-08-08 MED ORDER — ONDANSETRON HCL 4 MG PO TABS
4.0000 mg | ORAL_TABLET | ORAL | Status: DC | PRN
  Administered 2014-08-09: 4 mg via ORAL
  Filled 2014-08-08: qty 1

## 2014-08-08 MED ORDER — LACTATED RINGERS IV SOLN
INTRAVENOUS | Status: DC | PRN
  Administered 2014-08-08: 16:00:00 via INTRAVENOUS

## 2014-08-08 MED ORDER — MIDAZOLAM HCL 5 MG/5ML IJ SOLN
INTRAMUSCULAR | Status: DC | PRN
  Administered 2014-08-08 (×2): 0.5 mg via INTRAVENOUS
  Administered 2014-08-08: .25 mg via INTRAVENOUS
  Administered 2014-08-08: .5 mg via INTRAVENOUS
  Administered 2014-08-08: .25 mg via INTRAVENOUS

## 2014-08-08 MED ORDER — PROMETHAZINE HCL 25 MG/ML IJ SOLN: 6.2500 mg | INTRAMUSCULAR | Status: DC | PRN

## 2014-08-08 MED ORDER — ONDANSETRON HCL 4 MG/2ML IJ SOLN
INTRAMUSCULAR | Status: DC | PRN
  Administered 2014-08-08: 4 mg via INTRAVENOUS

## 2014-08-08 MED ORDER — KETOROLAC TROMETHAMINE 30 MG/ML IJ SOLN: 30.0000 mg | Freq: Once | INTRAMUSCULAR | Status: DC | PRN

## 2014-08-08 MED ORDER — MEPERIDINE HCL 25 MG/ML IJ SOLN
6.2500 mg | INTRAMUSCULAR | Status: DC | PRN
  Administered 2014-08-08 (×2): 6.25 mg via INTRAVENOUS

## 2014-08-08 MED ORDER — MIDAZOLAM HCL 2 MG/2ML IJ SOLN: 0.5000 mg | Freq: Once | INTRAMUSCULAR | Status: DC | PRN

## 2014-08-08 MED ORDER — FENTANYL CITRATE 0.05 MG/ML IJ SOLN
INTRAMUSCULAR | Status: AC
  Filled 2014-08-08: qty 2

## 2014-08-08 MED ORDER — MEPERIDINE HCL 25 MG/ML IJ SOLN
INTRAMUSCULAR | Status: AC
  Administered 2014-08-08: 6.25 mg via INTRAVENOUS
  Filled 2014-08-08: qty 1

## 2014-08-08 MED ORDER — SIMETHICONE 80 MG PO CHEW
80.0000 mg | CHEWABLE_TABLET | Freq: Three times a day (TID) | ORAL | Status: DC
  Administered 2014-08-09 – 2014-08-11 (×7): 80 mg via ORAL
  Filled 2014-08-08 (×6): qty 1

## 2014-08-08 MED ORDER — MENTHOL 3 MG MT LOZG
1.0000 | LOZENGE | OROMUCOSAL | Status: DC | PRN
  Administered 2014-08-10: 3 mg via ORAL
  Filled 2014-08-08: qty 9

## 2014-08-08 MED ORDER — OXYCODONE-ACETAMINOPHEN 5-325 MG PO TABS
2.0000 | ORAL_TABLET | ORAL | Status: DC | PRN
  Administered 2014-08-08 – 2014-08-11 (×5): 2 via ORAL
  Filled 2014-08-08 (×6): qty 2

## 2014-08-08 MED ORDER — OXYCODONE-ACETAMINOPHEN 5-325 MG PO TABS
1.0000 | ORAL_TABLET | ORAL | Status: DC | PRN
  Administered 2014-08-09 – 2014-08-11 (×6): 1 via ORAL
  Filled 2014-08-08 (×4): qty 1

## 2014-08-08 MED ORDER — LANOLIN HYDROUS EX OINT: 1.0000 "application " | TOPICAL_OINTMENT | CUTANEOUS | Status: DC | PRN

## 2014-08-08 MED ORDER — PHENYLEPHRINE 8 MG IN D5W 100 ML (0.08MG/ML) PREMIX OPTIME
INJECTION | INTRAVENOUS | Status: DC | PRN
  Administered 2014-08-08: 60 ug/min via INTRAVENOUS

## 2014-08-08 MED ORDER — BUPIVACAINE HCL (PF) 0.25 % IJ SOLN
INTRAMUSCULAR | Status: AC
  Filled 2014-08-08: qty 30

## 2014-08-08 MED ORDER — WITCH HAZEL-GLYCERIN EX PADS: 1.0000 "application " | MEDICATED_PAD | CUTANEOUS | Status: DC | PRN

## 2014-08-08 MED ORDER — MEPERIDINE HCL 25 MG/ML IJ SOLN: 6.2500 mg | INTRAMUSCULAR | Status: DC | PRN

## 2014-08-08 MED ORDER — ACETAMINOPHEN 325 MG PO TABS: 325.0000 mg | ORAL_TABLET | ORAL | Status: DC | PRN

## 2014-08-08 MED ORDER — CEFAZOLIN SODIUM-DEXTROSE 2-3 GM-% IV SOLR
INTRAVENOUS | Status: DC | PRN
  Administered 2014-08-08: 2 g via INTRAVENOUS

## 2014-08-08 MED ORDER — SCOPOLAMINE 1 MG/3DAYS TD PT72
1.0000 | MEDICATED_PATCH | Freq: Once | TRANSDERMAL | Status: DC
  Administered 2014-08-08: 1.5 mg via TRANSDERMAL

## 2014-08-08 MED ORDER — FENTANYL CITRATE 0.05 MG/ML IJ SOLN
25.0000 ug | INTRAMUSCULAR | Status: DC | PRN
  Administered 2014-08-08 (×3): 50 ug via INTRAVENOUS
  Administered 2014-08-08: 25 ug via INTRAVENOUS

## 2014-08-08 MED ORDER — BUPIVACAINE HCL (PF) 0.25 % IJ SOLN
INTRAMUSCULAR | Status: DC | PRN
  Administered 2014-08-08: 30 mL

## 2014-08-08 MED ORDER — IBUPROFEN 600 MG PO TABS
600.0000 mg | ORAL_TABLET | Freq: Four times a day (QID) | ORAL | Status: DC
  Administered 2014-08-08 – 2014-08-11 (×11): 600 mg via ORAL
  Filled 2014-08-08 (×11): qty 1

## 2014-08-08 MED ORDER — ZOLPIDEM TARTRATE 5 MG PO TABS: 5.0000 mg | ORAL_TABLET | Freq: Every evening | ORAL | Status: DC | PRN

## 2014-08-08 SURGICAL SUPPLY — 40 items
BENZOIN TINCTURE PRP APPL 2/3 (GAUZE/BANDAGES/DRESSINGS) ×3 IMPLANT
CATH ROBINSON RED A/P 16FR (CATHETERS) IMPLANT
CLAMP CORD UMBIL (MISCELLANEOUS) IMPLANT
CLOSURE WOUND 1/2 X4 (GAUZE/BANDAGES/DRESSINGS) ×1
CLOTH BEACON ORANGE TIMEOUT ST (SAFETY) ×3 IMPLANT
DRAPE SHEET LG 3/4 BI-LAMINATE (DRAPES) IMPLANT
DRSG OPSITE POSTOP 4X10 (GAUZE/BANDAGES/DRESSINGS) ×3 IMPLANT
DURAPREP 26ML APPLICATOR (WOUND CARE) ×3 IMPLANT
ELECT REM PT RETURN 9FT ADLT (ELECTROSURGICAL) ×3
ELECTRODE REM PT RTRN 9FT ADLT (ELECTROSURGICAL) ×1 IMPLANT
EXTRACTOR VACUUM M CUP 4 TUBE (SUCTIONS) IMPLANT
EXTRACTOR VACUUM M CUP 4' TUBE (SUCTIONS)
GLOVE BIOGEL PI IND STRL 7.5 (GLOVE) ×2 IMPLANT
GLOVE BIOGEL PI INDICATOR 7.5 (GLOVE) ×4
GLOVE ECLIPSE 7.5 STRL STRAW (GLOVE) ×3 IMPLANT
GOWN STRL REUS W/TWL LRG LVL3 (GOWN DISPOSABLE) ×9 IMPLANT
KIT ABG SYR 3ML LUER SLIP (SYRINGE) IMPLANT
NEEDLE HYPO 22GX1.5 SAFETY (NEEDLE) ×3 IMPLANT
NEEDLE HYPO 25X5/8 SAFETYGLIDE (NEEDLE) IMPLANT
NS IRRIG 1000ML POUR BTL (IV SOLUTION) ×3 IMPLANT
PACK C SECTION WH (CUSTOM PROCEDURE TRAY) ×3 IMPLANT
PAD OB MATERNITY 4.3X12.25 (PERSONAL CARE ITEMS) ×3 IMPLANT
RTRCTR C-SECT PINK 25CM LRG (MISCELLANEOUS) IMPLANT
SEPRAFILM MEMBRANE 5X6 (MISCELLANEOUS) ×3 IMPLANT
STAPLER VISISTAT 35W (STAPLE) ×3 IMPLANT
STRIP CLOSURE SKIN 1/2X4 (GAUZE/BANDAGES/DRESSINGS) ×2 IMPLANT
SUT MNCRL 0 VIOLET CTX 36 (SUTURE) ×4 IMPLANT
SUT MONOCRYL 0 CTX 36 (SUTURE) ×8
SUT PLAIN 2 0 XLH (SUTURE) ×3 IMPLANT
SUT VIC AB 0 CTX 36 (SUTURE) ×6
SUT VIC AB 0 CTX36XBRD ANBCTRL (SUTURE) ×3 IMPLANT
SUT VIC AB 2-0 CT1 27 (SUTURE) ×2
SUT VIC AB 2-0 CT1 TAPERPNT 27 (SUTURE) ×1 IMPLANT
SUT VIC AB 2-0 SH 27 (SUTURE) ×2
SUT VIC AB 2-0 SH 27XBRD (SUTURE) ×1 IMPLANT
SUT VIC AB 4-0 KS 27 (SUTURE) ×3 IMPLANT
SUT VICRYL 0 TIES 12 18 (SUTURE) ×3 IMPLANT
SYR 30ML LL (SYRINGE) ×3 IMPLANT
TOWEL OR 17X24 6PK STRL BLUE (TOWEL DISPOSABLE) ×3 IMPLANT
TRAY FOLEY CATH 14FR (SET/KITS/TRAYS/PACK) ×3 IMPLANT

## 2014-08-08 NOTE — MAU Provider Note (Addendum)
Faculty Practice H&P  Felicia Foster is a 29 y.o. female [phone] with IUP at [redacted]w[redacted]d presenting with contractions and abdominal pain.  Her pregnancy has been complicated by gestational diabetes, prior cesarean delivery x 4 with dense adhesions with her prior cesarean section.  She is found to have gestational hypertension.  Pt states she has been having occasional contractions, no vaginal bleeding, intact membranes, with normal fetal movement.     Prenatal Course Source of Care: [facility] - transferred care from [place].    Pregnancy complications or risks: Patient Active Problem List   Diagnosis Date Noted  . Previous preterm delivery x 3 (33, 36 and 35 weeks), antepartum 06/18/2014  . Previous cesarean delivery x 4, antepartum 06/18/2014  . Bipolar disease in pregnancy 06/12/2014  . History of gestational diabetes 06/12/2014  . History of congenital or genetic condition 06/12/2014  . Limited prenatal care in third trimester   . 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 Gestational diabetes mellitus, antepartum    Prenatal labs and studies: ABO, Rh: O/Positive/-- (06/16 0000) Antibody: Negative (06/16 0000) Rubella:   RPR: NON REAC (11/23 1423)  HBsAg: Negative (06/16 0000)  HIV: NONREACTIVE (11/23 1423)  GBS:    1 hr Glucola 152 with 3hr GTT: 82, 175, 81, 64 Genetic screeningcare too late Anatomy US normal  Past Medical History:  Past Medical History  Diagnosis Date  . Fibroids   . Mental disorder     bipolar  . Diabetes mellitus without complication   . Gestational diabetes     Past Surgical History:  Past Surgical History  Procedure Laterality Date  . Cesarean section      x 4  . Dilation and curettage of uterus      Obstetrical History:  OB History    Gravida Para Term Preterm AB TAB SAB Ectopic Multiple Living   8 4 1 3 3 2 1  0  4      Gynecological History:  OB History    Gravida Para Term Preterm AB TAB SAB Ectopic Multiple  Living   8 4 1 3 3 2 1  0  4      Social History:  History   Social History  . Marital Status: Single    Spouse Name: N/A    Number of Children: N/A  . Years of Education: N/A   Social History Main Topics  . Smoking status: Current Some Day Smoker -- 0.25 packs/day  . Smokeless tobacco: Never Used  . Alcohol Use: No  . Drug Use: No  . Sexual Activity: Not Currently    Birth Control/ Protection: None   Other Topics Concern  . None   Social History Narrative   ** Merged History Encounter **        Family History:  Family History  Problem Relation Age of Onset  . Cancer Mother     Medications:  Prenatal vitamins,  No current facility-administered medications for this encounter.    Allergies: No Known Allergies  Review of Systems: - negative  Physical Exam: Blood pressure 139/92, pulse 87, temperature 98.6 F (37 C), temperature source Oral, resp. rate 18, height 5\' 6"  (1.676 m), weight 270 lb (122.471 kg), last menstrual period 11/14/2013. GENERAL: Well-developed, well-nourished female in no acute distress.  LUNGS: Clear to auscultation bilaterally.  HEART: Regular rate and rhythm. ABDOMEN: Soft, nontender, nondistended, gravid. EFW 7 lbs EXTREMITIES: Nontender, no edema, 2+ distal pulses. FHT:  Baseline rate 130  bpm   Variability moderate  Accelerations present   Decelerations none Contractions: Every 0 mins   Pertinent Labs/Studies:   Lab Results  Component Value Date   WBC 7.1 08/08/2014   HGB 10.0* 08/08/2014   HCT 30.7* 08/08/2014   MCV 74.2* 08/08/2014   PLT 229 08/08/2014    Assessment : Felicia Foster is a 29 y.o. [id number] at [redacted]w[redacted]d being admitted for cesarean section secondary to gestational hypertension.  Plan: The risks of cesarean section discussed with the patient included but were not limited to: bleeding which may require transfusion or reoperation; infection which may require antibiotics; injury to bowel, bladder, ureters or other  surrounding organs; injury to the fetus; need for additional procedures including hysterectomy in the event of a life-threatening hemorrhage; placental abnormalities wth subsequent pregnancies, incisional problems, thromboembolic phenomenon and other postoperative/anesthesia complications. The patient concurred with the proposed plan, giving informed written consent for the procedure.   Patient has been NPO since midnight and will remain NPO for procedure.  Preoperative prophylactic Ancef ordered on call to the OR.    STINSON, [place] 08/08/2014, 2:57 PM  Pt seen and examined.  Agree with diagnosis of gestational hypertension and need for rpt c/s.  Pt understnads we will be doing a vertical skin incision and possible classical uterine incision if adhesions are too dense for a low transverse c/s.  Epidural will be anesthesia of choice in case of longer operating times.  Type and screen is pending and can have blood available if needed.  Pt declines BTL.

## 2014-08-08 NOTE — MAU Note (Signed)
C/o N&V this AM which lead into a sharp pain in lower pelvis and a headache; c/o contractions now also; scheduled for repeat C -section on 08-21-14;

## 2014-08-08 NOTE — Op Note (Signed)
Felicia Foster PROCEDURE DATE: 08/08/2014  PREOPERATIVE DIAGNOSIS: Intrauterine pregnancy at  [redacted]w[redacted]d weeks gestation; gestational hypertension, prior cesarean section x 4  POSTOPERATIVE DIAGNOSIS: The same, dense abdominal adhesions  PROCEDURE: Repeat Cesarean Section, Classical uterine incision  SURGEON:  Dr. Loma Boston  ASSISTANT: Dr Silas [place]  INDICATIONS: Felicia Foster is a 29 y.o. [id number] at [redacted]w[redacted]d scheduled for cesarean section secondary to Gestational Hypertension, prior cesarean section x 4.  The risks of cesarean section discussed with the patient included but were not limited to: bleeding which may require transfusion or reoperation; infection which may require antibiotics; injury to bowel, bladder, ureters or other surrounding organs; injury to the fetus; need for additional procedures including hysterectomy in the event of a life-threatening hemorrhage; placental abnormalities wth subsequent pregnancies, incisional problems, thromboembolic phenomenon and other postoperative/anesthesia complications. The patient concurred with the proposed plan, giving informed written consent for the procedure.    FINDINGS:  Viable female infant in cephalic presentation.  Apgars 4 and 9, weight pending. Clear amniotic fluid.  Intact placenta, three vessel cord.  Normal uterus, fallopian tubes and ovaries bilaterally.  ANESTHESIA:    Spinal INTRAVENOUS FLUIDS:2400 ml ESTIMATED BLOOD LOSS: 800 ml URINE OUTPUT:  200 ml SPECIMENS: Placenta sent to L&D COMPLICATIONS: Dense omental adhesions to the abdominal wall, dense uterine adhesions to the abdominal wall.  PROCEDURE IN DETAIL:  The patient received intravenous antibiotics and had sequential compression devices applied to her lower extremities while in the preoperative area.  She was then taken to the operating room where epidural anesthesia was dosed up to surgical level and was found to be adequate. She was then placed in a dorsal  supine position with a leftward tilt, and prepped and draped in a sterile manner.  A foley catheter was placed into her bladder and attached to constant gravity, which drained clear fluid throughout.  After an adequate timeout was performed, a vertical skin incision was made with scalpel and carried through to the underlying layer of fascia. The fascia was incised in the midline and this incision was extended bilaterally using Bovie. The rectus muscles were separated in the midline bluntly and the peritoneum was entered bluntly, finding the omentum adhered to the abdominal wall.  This was clamped with two Kellies, cut in the midline, and tied with free ties.  Further inspection revealed the left lower uterine segment densely adhere to the abdominal wall.  Attention was turned to the lower uterine segment where a transverse hysterotomy was made with a scalpel.  Due to difficulty in reaching the infant's head, the incision was extended vertically to the fundus with bandage scissors.   The infant was successfully delivered, and cord was clamped and cut and infant was handed over to awaiting neonatology team. Uterine massage was then administered and the placenta delivered intact with three-vessel cord. The uterus was then cleared of clot and debris.  The vertical hysterotomy was closed in two layers - the first with 0 Vicryl in a running locked fashion and the second with 0 Monocryl in a running locked fashion.  The transverse portion of the incision was also closed with 0 monocryl in two layers.  Overall, excellent hemostasis was noted. The abdomen and the pelvis were cleared of all clot and debris. Hemostasis was confirmed on all surfaces.  Seprafilm was placed on top of the closed hysterotomy.  The fascia was then closed using 0 looped PDS in a running fashion.  The subcutaneous layer was reapproximated with plain gut and  the skin was closed with staples. The patient tolerated the procedure well. Sponge, lap,  instrument and needle counts were correct x 2. She was taken to the recovery room in stable condition.   Post operatively, the patient was told that I recommend that she should not become pregnant again, due to the complicated cesarean section.  Additionally, I discussed that if she were to become pregnant again, that she should not labor and would have to be delivered at 36 weeks due to her Classical Cesarean Section.   Loma Boston JEHIEL DO 08/08/2014 5:14 PM

## 2014-08-08 NOTE — Anesthesia Procedure Notes (Signed)
Epidural Patient location during procedure: OR Start time: 08/08/2014 3:37 PM  Staffing Anesthesiologist: Rudean Curt Performed by: anesthesiologist   Preanesthetic Checklist Completed: patient identified, site marked, surgical consent, pre-op evaluation, timeout performed, IV checked, risks and benefits discussed and monitors and equipment checked  Epidural Patient position: sitting Prep: site prepped and draped and DuraPrep Patient monitoring: continuous pulse ox and blood pressure Approach: midline Location: L3-L4 Injection technique: LOR air and LOR saline  Needle:  Needle type: Tuohy  Needle gauge: 17 G Needle length: 9 cm and 9 Needle insertion depth: 7 cm Catheter type: closed end flexible Catheter size: 19 Gauge Catheter at skin depth: 12 cm Test dose: negative  Assessment Events: blood not aspirated, injection not painful, no injection resistance, negative IV test and no paresthesia  Additional Notes Patient identified.  Risk benefits discussed including failed block, incomplete pain control, headache, nerve damage, paralysis, blood pressure changes, nausea, vomiting, reactions to medication both toxic or allergic, and postpartum back pain.  Patient expressed understanding and wished to proceed.  All questions were answered.  Sterile technique used throughout procedure and epidural site dressed with sterile barrier dressing. No paresthesia or other complications noted.The patient did not experience any signs of intravascular injection such as tinnitus or metallic taste in mouth nor signs of intrathecal spread such as rapid motor block. Please see nursing notes for vital signs.

## 2014-08-08 NOTE — MAU Note (Signed)
Started contracting at 0400. Had weird cramp in incisional area earlier, lasted about 5-10 min.  No bleeding or leaking.

## 2014-08-08 NOTE — MAU Note (Signed)
Nursery, OR, L&D scrub tech and house coverage aware of c/s.  anes in to see pt.

## 2014-08-08 NOTE — Anesthesia Preprocedure Evaluation (Addendum)
Anesthesia Evaluation  Patient identified by MRN, date of birth, ID band Patient awake    Reviewed: Allergy & Precautions, H&P , NPO status , Patient's Chart, lab work & pertinent test results  Airway Mallampati: III       Dental   Pulmonary Current Smoker,  breath sounds clear to auscultation        Cardiovascular Exercise Tolerance: Good hypertension, Rhythm:regular Rate:Normal     Neuro/Psych PSYCHIATRIC DISORDERS Bipolar Disorder    GI/Hepatic   Endo/Other  diabetesMorbid obesity  Renal/GU      Musculoskeletal   Abdominal   Peds  Hematology   Anesthesia Other Findings   Reproductive/Obstetrics (+) Pregnancy                            Anesthesia Physical Anesthesia Plan  ASA: III and emergent  Anesthesia Plan: Epidural   Post-op Pain Management:    Induction:   Airway Management Planned:   Additional Equipment:   Intra-op Plan:   Post-operative Plan:   Informed Consent: I have reviewed the patients History and Physical, chart, labs and discussed the procedure including the risks, benefits and alternatives for the proposed anesthesia with the patient or authorized representative who has indicated his/her understanding and acceptance.     Plan Discussed with: Anesthesiologist, CRNA and Surgeon  Anesthesia Plan Comments:        Anesthesia Quick Evaluation

## 2014-08-08 NOTE — H&P (Signed)
Faculty Practice H&P  Felicia Foster is a 29 y.o. female [phone] with IUP at [redacted]w[redacted]d presenting with contractions and abdominal pain.  Her pregnancy has been complicated by gestational diabetes, prior cesarean delivery x 4 with dense adhesions with her prior cesarean section.  She is found to have gestational hypertension.  Pt states she has been having occasional contractions, no vaginal bleeding, intact membranes, with normal fetal movement.     Prenatal Course Source of Care: [facility] - transferred care from [place].    Pregnancy complications or risks: Patient Active Problem List   Diagnosis Date Noted  . Previous preterm delivery x 3 (33, 36 and 35 weeks), antepartum 06/18/2014  . Previous cesarean delivery x 4, antepartum 06/18/2014  . Bipolar disease in pregnancy 06/12/2014  . History of gestational diabetes 06/12/2014  . History of congenital or genetic condition 06/12/2014  . Limited prenatal care in third trimester   . 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 Gestational diabetes mellitus, antepartum    Prenatal labs and studies: ABO, Rh: O/Positive/-- (06/16 0000) Antibody: Negative (06/16 0000) Rubella:   RPR: NON REAC (11/23 1423)  HBsAg: Negative (06/16 0000)  HIV: NONREACTIVE (11/23 1423)  GBS:    1 hr Glucola 152 with 3hr GTT: 82, 175, 81, 64 Genetic screeningcare too late Anatomy US normal  Past Medical History:  Past Medical History  Diagnosis Date  . Fibroids   . Mental disorder     bipolar  . Diabetes mellitus without complication   . Gestational diabetes     Past Surgical History:  Past Surgical History  Procedure Laterality Date  . Cesarean section      x 4  . Dilation and curettage of uterus      Obstetrical History:  OB History    Gravida Para Term Preterm AB TAB SAB Ectopic Multiple Living   8 4 1 3 3 2 1  0  4      Gynecological History:  OB History    Gravida Para Term Preterm AB TAB SAB Ectopic Multiple  Living   8 4 1 3 3 2 1  0  4      Social History:  History   Social History  . Marital Status: Single    Spouse Name: N/A    Number of Children: N/A  . Years of Education: N/A   Social History Main Topics  . Smoking status: Current Some Day Smoker -- 0.25 packs/day  . Smokeless tobacco: Never Used  . Alcohol Use: No  . Drug Use: No  . Sexual Activity: Not Currently    Birth Control/ Protection: None   Other Topics Concern  . None   Social History Narrative   ** Merged History Encounter **        Family History:  Family History  Problem Relation Age of Onset  . Cancer Mother     Medications:  Prenatal vitamins,  No current facility-administered medications for this encounter.    Allergies: No Known Allergies  Review of Systems: - negative  Physical Exam: Blood pressure 139/92, pulse 87, temperature 98.6 F (37 C), temperature source Oral, resp. rate 18, height 5\' 6"  (1.676 m), weight 270 lb (122.471 kg), last menstrual period 11/14/2013. GENERAL: Well-developed, well-nourished female in no acute distress.  LUNGS: Clear to auscultation bilaterally.  HEART: Regular rate and rhythm. ABDOMEN: Soft, nontender, nondistended, gravid. EFW 7 lbs EXTREMITIES: Nontender, no edema, 2+ distal pulses. FHT:  Baseline rate 130  bpm   Variability moderate  Accelerations present   Decelerations none Contractions: Every 0 mins   Pertinent Labs/Studies:   Lab Results  Component Value Date   WBC 7.1 08/08/2014   HGB 10.0* 08/08/2014   HCT 30.7* 08/08/2014   MCV 74.2* 08/08/2014   PLT 229 08/08/2014    Assessment : Felicia Foster is a 29 y.o. [id number] at [redacted]w[redacted]d being admitted for cesarean section secondary to gestational hypertension.  Plan: The risks of cesarean section discussed with the patient included but were not limited to: bleeding which may require transfusion or reoperation; infection which may require antibiotics; injury to bowel, bladder, ureters or other  surrounding organs; injury to the fetus; need for additional procedures including hysterectomy in the event of a life-threatening hemorrhage; placental abnormalities wth subsequent pregnancies, incisional problems, thromboembolic phenomenon and other postoperative/anesthesia complications. The patient concurred with the proposed plan, giving informed written consent for the procedure.   Patient has been NPO since midnight and will remain NPO for procedure.  Preoperative prophylactic Ancef ordered on call to the OR.    [name] JEHIEL 08/08/2014, 2:57 PM

## 2014-08-08 NOTE — Lactation Note (Signed)
This note was copied from the chart of Felicia Calen [name]. Lactation Consultation Note Report from [facility] RN that mom has bottle fed her other 4 children and doesn't want to breast feed this baby, but that she feels quilted into trying.  [facility] RN discussed breastfeeding with mom at length.  Postponing LC visit at this time.     Patient Name: Felicia Foster Today's Date: 08/08/2014     Maternal Data    Feeding Feeding Type: Formula Nipple Type: Slow - flow Length of feed: 5 min (mom asked for baby to be removed)  LATCH Score/Interventions Latch: Grasps breast easily, tongue down, lips flanged, rhythmical sucking.  Audible Swallowing: A few with stimulation Intervention(s): Skin to skin  Type of Nipple: Everted at rest and after stimulation  Comfort (Breast/Nipple): Soft / non-tender     Hold (Positioning): Assistance needed to correctly position infant at breast and maintain latch.  LATCH Score: 8  Lactation Tools Discussed/Used     Consult Status      Viren Lebeau, Justine Null 08/08/2014, 10:12 PM

## 2014-08-08 NOTE — Progress Notes (Signed)
After reviewing patient's record, her antibody screen was negative and can proceed to surgery without receiving the results of her type and screen.  Felicia Foster Felicia Foster 08/08/2014 3:12 PM

## 2014-08-08 NOTE — Transfer of Care (Signed)
Immediate Anesthesia Transfer of Care Note  Patient: Felicia Foster  Procedure(s) Performed: Procedure(s) with comments: CESAREAN SECTION (N/A) - repeat  Patient Location: PACU  Anesthesia Type:Epidural  Level of Consciousness: awake, alert  and oriented  Airway & Oxygen Therapy: Patient Spontanous Breathing  Post-op Assessment: Report given to PACU RN and Post -op Vital signs reviewed and stable  Post vital signs: Reviewed and stable  Complications: No apparent anesthesia complications

## 2014-08-08 NOTE — Anesthesia Postprocedure Evaluation (Signed)
  Anesthesia Post Note  Patient: Felicia Foster  Procedure(s) Performed: Procedure(s) (LRB): CESAREAN SECTION (N/A)  Anesthesia type: Epidural  Patient location: PACU  Post pain: Pain level controlled  Post assessment: Post-op Vital signs reviewed  Last Vitals:  Filed Vitals:   08/08/14 1142  BP: 139/92  Pulse: 87  Temp:   Resp:     Post vital signs: Reviewed  Level of consciousness: awake  Complications: No apparent anesthesia complications

## 2014-08-09 ENCOUNTER — Encounter (HOSPITAL_COMMUNITY): Payer: Self-pay | Admitting: Anesthesiology

## 2014-08-09 ENCOUNTER — Encounter: Payer: Medicare Other | Admitting: Obstetrics & Gynecology

## 2014-08-09 ENCOUNTER — Encounter (HOSPITAL_COMMUNITY): Payer: Self-pay | Admitting: Family Medicine

## 2014-08-09 ENCOUNTER — Inpatient Hospital Stay (HOSPITAL_COMMUNITY): Payer: Medicare Other

## 2014-08-09 LAB — CBC
HEMATOCRIT: 26.8 % — AB (ref 36.0–46.0)
HEMOGLOBIN: 8.8 g/dL — AB (ref 12.0–15.0)
MCH: 24.4 pg — AB (ref 26.0–34.0)
MCHC: 32.8 g/dL (ref 30.0–36.0)
MCV: 74.4 fL — ABNORMAL LOW (ref 78.0–100.0)
Platelets: 184 10*3/uL (ref 150–400)
RBC: 3.6 MIL/uL — ABNORMAL LOW (ref 3.87–5.11)
RDW: 13.4 % (ref 11.5–15.5)
WBC: 9.1 10*3/uL (ref 4.0–10.5)

## 2014-08-09 LAB — RPR

## 2014-08-09 MED ORDER — FAMOTIDINE 20 MG PO TABS
20.0000 mg | ORAL_TABLET | Freq: Two times a day (BID) | ORAL | Status: DC
Start: 2014-08-09 — End: 2014-08-11
  Administered 2014-08-09 – 2014-08-11 (×3): 20 mg via ORAL
  Filled 2014-08-09 (×4): qty 1

## 2014-08-09 MED ORDER — HYDROXYZINE HCL 50 MG PO TABS
50.0000 mg | ORAL_TABLET | Freq: Once | ORAL | Status: DC
  Filled 2014-08-09: qty 1

## 2014-08-09 MED ORDER — FERROUS FUMARATE 325 (106 FE) MG PO TABS
1.0000 | ORAL_TABLET | Freq: Two times a day (BID) | ORAL | Status: DC
  Administered 2014-08-09 – 2014-08-11 (×4): 106 mg via ORAL
  Filled 2014-08-09 (×7): qty 1

## 2014-08-09 NOTE — Progress Notes (Signed)
Clinical Social Work Department PSYCHOSOCIAL ASSESSMENT - MATERNAL/CHILD 03-08-15  Patient:  Felicia Foster  Account Number:  [id number]  Admit Date:  May 29, 2015  Clinical Social Worker:  [place], CLINICAL SOCIAL WORKER   Date/Time:  February 18, 2015 02:15 PM  Date Referred:  May 24, 2015   Referral source  [place]     Referred reason  Behavioral Health Issues   Other referral source:    I:  FAMILY / [place] legal guardian:  PARENT  Guardian - Name Guardian - Age Guardian - Address  [place] [address], [place] [address]  [place]  same as above   Other household support members/support persons Name Relationship DOB    31 years old    63 yeas old    31 years old    31 years old   Other support:   [name] reported receiving family support, and stated that they live nearby.  She did not specify exact family members, but stated that she has family that lives in the area.    II  PSYCHOSOCIAL DATA Information Source:  Family Interview  Financial and [employer] Employment:   [name] stated that she is on disability and does not work. The FOB shared that he works for the [place].   Financial resources:  [place] If [place] - County:    School / Grade:  N/A Music therapist / Child Services Coordination / Early Interventions:   None reported  Cultural issues impacting care:   None reported    III  STRENGTHS Strengths  Adequate Resources  Home prepared for Child (including basic supplies)  Supportive family/friends   Strength comment:    IV  RISK FACTORS AND CURRENT PROBLEMS Current Problem:  YES   Risk Factor & Current Problem Patient Issue Family Issue Risk Factor / Current Problem Comment  Mental Illness Y N [name] presents with history of bipolar since age 65. [name] denied medication since age [age], but stated that she received therapy prior to moving to [place].  She denied any  manic/depressive episodes in "years".    V  SOCIAL WORK ASSESSMENT CSW met with the [name] due to history of bipolar.  [name] provided consent for the assessment, and also provided consent for the FOB to be present.  The FOB did not participate unless directly prompted.  The [name] was not forthcoming with information, provided short/concise answers, and often asked for more specific/close ended questions instead of the open ended questions that CSW usually utilizes.  The [name] displayed a limited range in affect, but was in a pleasant mood. [name] did not present with any acute mental health symptoms, and she was observed to be interacting and bonding with the baby.   The [name] stated that she and the FOB live in [place] with her other children, ages 41,7,9, and 51.  The [name] was not forthcoming with information regarding her other children, but identified the schools they attend and how they have adjusted well to the move.  She shared that she and her family moved from [place] in the middle of October in order to have a "fresh" start.  The [name] did not clarify what she was needing a fresh start for, but denied need to "start over" or need to leave a negative situation.  The [name] shared that she does have family support in the area, and reported that her family is helping her provide care for her other children while she is at the  hospital.  The FOB stated that he works for [place], and recently changed his work shift so that he can be around the baby more. The [name] stated that she receives disability and does not work.  When asked how she feels to have a newborn again, she reported, "there is an adventure around every corner".   The [name] reported that they have basic baby supplies for the baby.   CSW inquired about mental health history.  [name] responded by stating, "what do you want to know".  CSW inquired about history of bipolar.  She requested more specific questions.  Per [name], she was diagnosed at age 42 since she was  "running away from home".  She stated that she has not been on any medications since age [age] and that she has not been hospitalized for her bipolar since adolescence.  She denied awareness/presence of any manic or depressive episodes, and when CSW inquired about specific symptoms that may occur, she smiled, and said "no" to all inquiries.  When CSW inquired about how she feels about the diagnosis since the information she is providing would indicate that it is not an accurate diagnosis, she stated, "well it's on my chart, and it's okay that it's there".  [name] reported that she did receive therapy while living in [place], but did not confirm or deny if it was effective or what issues were addressed.  CSW noted that [name] receives [place], and [name] reported that it is due to bipolar diagnosis. She stated that she first applied for [place]/disability at age 30, was turned down, but was successful when she reapplied at age 51.  CSW validated the difficulties of receiving [place]/diability for bipolar since it needs to negatively impact her life, but the [name] continued to not identify additional information.   [name] denied history of postpartum depression, but acknowledged increased risk due to history of bipolar.  She was agreeable to speaking with her doctor if she notes any symptoms.  CSW spoke with RN who stated that the [name] has been attentive and appropriate with the baby.  CSW requested additional information/concerns given that the [name] was difficult to engage and was not a forthcoming historian about her mental health.   [place]  Information/Referral to [place]  No Further Intervention Required / No Barriers to Discharge   Type of pt/family education:   Postpartum depression   If child protective services report - county:  N/A If child protective services report - date:  N/A Information/referral to community resources comment:   [place].  [name] declined need for referral for medication management.   Other social work plan:   CSW to follow up PRN.

## 2014-08-09 NOTE — Anesthesia Postprocedure Evaluation (Signed)
  Anesthesia Post-op Note  Patient: Felicia Foster  Procedure(s) Performed: Procedure(s): REPEAT CESAREAN SECTION (N/A)  Patient Location: Mother/Baby  Anesthesia Type:Epidural  Level of Consciousness: awake, alert , oriented and patient cooperative  Airway and Oxygen Therapy: Patient Spontanous Breathing  Post-op Pain: mild  Post-op Assessment: Patient's Cardiovascular Status Stable, Respiratory Function Stable, No headache, No backache, No residual numbness and No residual motor weakness  Post-op Vital Signs: stable  Last Vitals:  Filed Vitals:   08/09/14 0355  BP: 141/74  Pulse: 74  Temp: 36.8 C  Resp: 20    Complications: No apparent anesthesia complications

## 2014-08-09 NOTE — Progress Notes (Signed)
Subjective: Postpartum Day #1: Classical Cesarean Delivery Patient reports tolerating PO.  Denies s/s anemia. Bottle feeding with formula, but desires to pump & bottle feed. Plans abstinance for contraception at this time.    Objective: Vital signs in last 24 hours: Temp:  [97.3 F (36.3 C)-98.9 F (37.2 C)] 98.3 F (36.8 C) (01/07 0831) Pulse Rate:  [74-101] 87 (01/07 0831) Resp:  [11-29] 18 (01/07 0831) BP: (112-173)/(47-120) 112/69 mmHg (01/07 0831) SpO2:  [93 %-100 %] 98 % (01/07 0831) Weight:  [122.471 kg (270 lb)] 122.471 kg (270 lb) (01/06 1037)  Physical Exam:  General: alert and cooperative Lochia: appropriate Uterine Fundus: firm Incision: no significant drainage DVT Evaluation: No evidence of DVT seen on physical exam. Negative Homan's sign. No cords or calf tenderness. No significant calf/ankle edema.   Recent Labs  08/08/14 1105 08/09/14 0600  HGB 10.0* 8.8*  HCT 30.7* 26.8*    Assessment/Plan: Status post Cesarean section. Doing well postoperatively.  Continue current care. FeSo4 bid  Serita Grammes 08/09/2014, 9:08 AM

## 2014-08-10 NOTE — Progress Notes (Signed)
Subjective: Postpartum Day #2: Classical Cesarean Delivery Patient reports tolerating PO.  Denies s/s anemia. Bottle feeding with formula, but desires to pump & bottle feed. Plans IUD for contraception at this time. Also considering "becoming a Nun" when inquired about contraception.    Objective: Vital signs in last 24 hours: Temp:  [98.3 F (36.8 C)-98.7 F (37.1 C)] 98.7 F (37.1 C) (01/08 0651) Pulse Rate:  [103-104] 104 (01/08 0651) Resp:  [17-18] 17 (01/08 0651) BP: (134-158)/(80-87) 134/80 mmHg (01/08 0651) SpO2:  [98 %-99 %] 99 % (01/08 0651)  Physical Exam:  General: alert and cooperative Lochia: appropriate Uterine Fundus: firm Incision: no significant drainage DVT Evaluation: No evidence of DVT seen on physical exam. Negative Homan's sign. No cords or calf tenderness. No significant calf/ankle edema.   Recent Labs  08/08/14 1105 08/09/14 0600  HGB 10.0* 8.8*  HCT 30.7* 26.8*    Assessment/Plan: Status post Cesarean section. Doing well postoperatively.  Continue current care. FeSo4 bid  Elberta Leatherwood 08/10/2014, 2:23 PM

## 2014-08-11 MED ORDER — BISACODYL 10 MG RE SUPP: 10.0000 mg | Freq: Every day | RECTAL | Status: DC | PRN

## 2014-08-11 MED ORDER — FERROUS FUMARATE 325 (106 FE) MG PO TABS: 1.0000 | ORAL_TABLET | Freq: Two times a day (BID) | ORAL | Status: DC

## 2014-08-11 MED ORDER — OXYCODONE-ACETAMINOPHEN 5-325 MG PO TABS: 1.0000 | ORAL_TABLET | ORAL | Status: DC | PRN

## 2014-08-11 MED ORDER — IBUPROFEN 600 MG PO TABS: 600.0000 mg | ORAL_TABLET | Freq: Four times a day (QID) | ORAL | Status: DC

## 2014-08-11 NOTE — Discharge Summary (Signed)
Obstetric Discharge Summary Reason for Admission: cesarean section- scheduled repeat for gHTN Prenatal Procedures: none Intrapartum Procedures: cesarean: classical Postpartum Procedures: none Complications-Operative and Postpartum: none HEMOGLOBIN  Date Value Ref Range Status  08/09/2014 8.8* 12.0 - 15.0 g/dL Final  01/16/2014 11.4 g/dL Final   HCT  Date Value Ref Range Status  08/09/2014 26.8* 36.0 - 46.0 % Final  01/16/2014 36 % Final   Ms Glore is a 29yo [id number] who was scheduled on 1/6 for a classical C/S @ 37.2wks due to gHTN (prev c/s x 4). She was also noted to have GDM as well. She tolerated the procedure well- see Op Note. By POD#3 she is doing well but is concerned re not having a BM yet and she does not feel she is passing gas although bowel sounds are active. She is ambulating, eating and voiding without difficulty. I feel that she has benefitted from her hospital stay and is ready for discharge. She may elect to take a Dulcolax supp prior to d/c. She is bottlefeeding and says she is interested in an IUD PP. She had a SW consult while an inpt due to hx of bipolar and depression- no barriers to d/c.  Physical Exam:  General: alert, cooperative and mild distress  Abd: sl distended but soft Lochia: appropriate Uterine Fundus: firm Incision: classical inc w/ honeycomb is intact DVT Evaluation: No evidence of DVT seen on physical exam.  Discharge Diagnoses: Term Pregnancy-delivered  Discharge Information: Date: 08/11/2014 Activity: pelvic rest Diet: routine Medications: PNV, Ibuprofen, Iron and Percocet Condition: stable Instructions: refer to practice specific booklet Discharge to: home Follow-up Information    Follow up with [facility]. Schedule an appointment as soon as possible for a visit in 1 week.   Why:  to get your staples taken out.   Contact information:   [place] [phone]      Newborn  Data: Live born female  Birth Weight: 6 lb 15.5 oz (3160 g) APGAR: 4, 9  Home with mother.  Serita Grammes CNM 08/11/2014, 10:13 AM

## 2014-08-11 NOTE — Discharge Instructions (Signed)

## 2014-08-11 NOTE — Progress Notes (Signed)
Pt c/o constipation and not passing gas. Encouraged to walk, and pt remains in bed. Have not observed pt OOB except to use the restroom. Gave warm prune/apple juice mixture. Pt c/o cramping and burning at incision site. [place]

## 2014-08-11 NOTE — Plan of Care (Signed)
Problem: Discharge Progression Outcomes Goal: Remove staples per MD order Outcome: Not Applicable Date Met:  72/53/66 Staples to be removed in MD office

## 2014-08-13 ENCOUNTER — Telehealth: Payer: Self-pay | Admitting: *Deleted

## 2014-08-13 NOTE — Telephone Encounter (Signed)
[place] called and left a message she a c/section last Wednesday and she removed the bandage yesterday as instructed.  States there was a green ? Unable to understand word she used.  States she wasn't sure about that and wants a call back.  Called [name] and left a message we are returning your call, please call the clinic.

## 2014-08-13 NOTE — Telephone Encounter (Signed)
[place] called back and call transferred to nurse. She reports she saw some greenish discharge on the honeycomb dressing. She denies fever, denies any further discharge, denies redness, denies odor, denies edema or wound opening. I instructed her to take daily showers keeping wound clean and dry, check temperature twice a day, check wound twice a day.  Instructed her to come to MAU if she any discharge, any redness or edema at wound, any wound opening , any fever or any problems.  She voices understanding.  She also states she wasn't have any vaginal bleeding at first, but is now- states changing pad every 3 hours. We discussed this is normal - call if gets heavy or go to MAU.

## 2014-08-15 ENCOUNTER — Ambulatory Visit: Payer: Medicare Other | Admitting: *Deleted

## 2014-08-15 VITALS — BP 148/84 | HR 89 | Temp 99.0°F | Wt 257.6 lb

## 2014-08-15 DIAGNOSIS — Z4802 Encounter for removal of sutures: Secondary | ICD-10-CM

## 2014-08-15 NOTE — Progress Notes (Signed)
States when removed dressing 08/13/14  noted a greenish spot on dressing, but wound ok. Since then states hasn't checked her temperature because she doesn't have a thermometer. States woke up hot and drenched in sweat this am. States when looks at wound is red but no drainage.  When checked wound , wound intact with pinkness noted at staple edges and dried dark red/black drainage at top staplesites.  Patient tender when abdomen touched close to incision. Kathrine Haddock, CNM in to assess wound and tenderness.  Also c/o passed clot just smaller than a golf ball that was grayish /reddish and slimy on Sunday. Since  Then had been bleeding and changing pad every 2 hours or so.  Dr. Elly Modena in to assess wound and review history.  Instructed to take out staples and instructed Ramonica to come to MAU if fever, wound becomes red, edematous, or has discharge or wound opens up.  Also discussed bleeding with Dr. Elly Modena and informed patient is within normal limits and may last up to 6 weeks.  Removed every other staple and placed steripstips. Noted at upper inch of wound with area opened about 1/2 inch showing pink tissue.  Requested Kathrine Haddock, CNM in to view wound - instructed to leave staple at that site- about 1 inch from top of wound, remove all others and patient to come back in one week for wound check and remove final staple.   Removed all staples except top on, placed additional steristrips and closed open area with steristips. Again instructed patient to come to MAU if any problems with wound or fever.  Delayni voices understanding.

## 2014-08-18 ENCOUNTER — Inpatient Hospital Stay (HOSPITAL_COMMUNITY)
Admission: AD | Admit: 2014-08-18 | Discharge: 2014-08-19 | DRG: 776 | Disposition: A | Discharge status: Home / Self Care | Payer: Medicare Other | Source: Ambulatory Visit | Attending: Obstetrics & Gynecology | Admitting: Obstetrics & Gynecology

## 2014-08-18 ENCOUNTER — Encounter (HOSPITAL_COMMUNITY): Payer: Self-pay | Admitting: *Deleted

## 2014-08-18 DIAGNOSIS — O9089 Other complications of the puerperium, not elsewhere classified: Secondary | ICD-10-CM | POA: Diagnosis not present

## 2014-08-18 DIAGNOSIS — O99335 Smoking (tobacco) complicating the puerperium: Secondary | ICD-10-CM | POA: Diagnosis present

## 2014-08-18 DIAGNOSIS — O135 Gestational [pregnancy-induced] hypertension without significant proteinuria, complicating the puerperium: Secondary | ICD-10-CM

## 2014-08-18 DIAGNOSIS — O1093 Unspecified pre-existing hypertension complicating the puerperium: Secondary | ICD-10-CM

## 2014-08-18 DIAGNOSIS — O1494 Unspecified pre-eclampsia, complicating childbirth: Secondary | ICD-10-CM | POA: Diagnosis present

## 2014-08-18 DIAGNOSIS — O165 Unspecified maternal hypertension, complicating the puerperium: Secondary | ICD-10-CM | POA: Diagnosis present

## 2014-08-18 DIAGNOSIS — I158 Other secondary hypertension: Secondary | ICD-10-CM | POA: Diagnosis present

## 2014-08-18 LAB — COMPREHENSIVE METABOLIC PANEL
ALT: 30 U/L (ref 0–35)
ANION GAP: 7 (ref 5–15)
AST: 18 U/L (ref 0–37)
Albumin: 2.8 g/dL — ABNORMAL LOW (ref 3.5–5.2)
Alkaline Phosphatase: 90 U/L (ref 39–117)
BUN: 13 mg/dL (ref 6–23)
CHLORIDE: 107 meq/L (ref 96–112)
CO2: 27 mmol/L (ref 19–32)
Calcium: 8.7 mg/dL (ref 8.4–10.5)
Creatinine, Ser: 0.69 mg/dL (ref 0.50–1.10)
GFR calc Af Amer: >90 mL/min (ref >90)
GFR calc non Af Amer: >90 mL/min (ref >90)
GLUCOSE: 101 mg/dL — AB (ref 70–99)
POTASSIUM: 3.8 mmol/L (ref 3.5–5.1)
Sodium: 141 mmol/L (ref 135–145)
Total Bilirubin: 0.4 mg/dL (ref 0.3–1.2)
Total Protein: 6.6 g/dL (ref 6.0–8.3)

## 2014-08-18 LAB — CBC
HEMATOCRIT: 28.3 % — AB (ref 36.0–46.0)
Hemoglobin: 9 g/dL — ABNORMAL LOW (ref 12.0–15.0)
MCH: 23.7 pg — ABNORMAL LOW (ref 26.0–34.0)
MCHC: 31.8 g/dL (ref 30.0–36.0)
MCV: 74.5 fL — AB (ref 78.0–100.0)
PLATELETS: 442 10*3/uL — AB (ref 150–400)
RBC: 3.8 MIL/uL — AB (ref 3.87–5.11)
RDW: 14 % (ref 11.5–15.5)
WBC: 8.3 10*3/uL (ref 4.0–10.5)

## 2014-08-18 LAB — URINALYSIS, ROUTINE W REFLEX MICROSCOPIC
Bilirubin Urine: NEGATIVE
Glucose, UA: NEGATIVE mg/dL
Hgb urine dipstick: NEGATIVE
KETONES UR: NEGATIVE mg/dL
Leukocytes, UA: NEGATIVE
Nitrite: NEGATIVE
PH: 6.5 (ref 5.0–8.0)
Protein, ur: NEGATIVE mg/dL
Specific Gravity, Urine: 1.015 (ref 1.005–1.030)
Urobilinogen, UA: 2 mg/dL — ABNORMAL HIGH (ref 0.0–1.0)

## 2014-08-18 MED ORDER — AMLODIPINE BESYLATE 5 MG PO TABS
5.0000 mg | ORAL_TABLET | Freq: Every day | ORAL | Status: DC
  Administered 2014-08-19 (×2): 5 mg via ORAL
  Filled 2014-08-18 (×2): qty 1

## 2014-08-18 MED ORDER — OXYCODONE-ACETAMINOPHEN 5-325 MG PO TABS: 1.0000 | ORAL_TABLET | ORAL | Status: DC | PRN

## 2014-08-18 MED ORDER — ONDANSETRON HCL 4 MG/2ML IJ SOLN: 4.0000 mg | Freq: Four times a day (QID) | INTRAMUSCULAR | Status: DC | PRN

## 2014-08-18 MED ORDER — MAGNESIUM SULFATE 4 GM/100ML IV SOLN: 4.0000 g | Freq: Once | INTRAVENOUS | Status: DC

## 2014-08-18 MED ORDER — IBUPROFEN 600 MG PO TABS
600.0000 mg | ORAL_TABLET | Freq: Four times a day (QID) | ORAL | Status: DC
  Administered 2014-08-18 – 2014-08-19 (×5): 600 mg via ORAL
  Filled 2014-08-18 (×5): qty 1

## 2014-08-18 MED ORDER — ALUM & MAG HYDROXIDE-SIMETH 200-200-20 MG/5ML PO SUSP
30.0000 mL | Freq: Four times a day (QID) | ORAL | Status: DC | PRN
  Filled 2014-08-18: qty 30

## 2014-08-18 MED ORDER — ZOLPIDEM TARTRATE 5 MG PO TABS: 5.0000 mg | ORAL_TABLET | Freq: Every evening | ORAL | Status: DC | PRN

## 2014-08-18 MED ORDER — MAGNESIUM SULFATE BOLUS VIA INFUSION
4.0000 g | Freq: Once | INTRAVENOUS | Status: AC
  Administered 2014-08-18: 4 g via INTRAVENOUS
  Filled 2014-08-18: qty 500

## 2014-08-18 MED ORDER — MAGNESIUM SULFATE 40 G IN LACTATED RINGERS - SIMPLE: 2.0000 g/h | Freq: Once | INTRAVENOUS | Status: DC

## 2014-08-18 MED ORDER — OXYCODONE-ACETAMINOPHEN 5-325 MG PO TABS
1.0000 | ORAL_TABLET | ORAL | Status: DC | PRN
  Administered 2014-08-18: 1 via ORAL
  Administered 2014-08-18: 2 via ORAL
  Administered 2014-08-18: 1 via ORAL
  Administered 2014-08-18 – 2014-08-19 (×2): 2 via ORAL
  Administered 2014-08-19: 1 via ORAL
  Filled 2014-08-18: qty 1
  Filled 2014-08-18 (×2): qty 2
  Filled 2014-08-18 (×2): qty 1

## 2014-08-18 MED ORDER — DOCUSATE SODIUM 100 MG PO CAPS
100.0000 mg | ORAL_CAPSULE | Freq: Two times a day (BID) | ORAL | Status: DC
  Filled 2014-08-18 (×3): qty 1

## 2014-08-18 MED ORDER — OXYCODONE-ACETAMINOPHEN 5-325 MG PO TABS
1.0000 | ORAL_TABLET | ORAL | Status: AC
  Administered 2014-08-18: 1 via ORAL
  Filled 2014-08-18: qty 1

## 2014-08-18 MED ORDER — ASPIRIN EC 81 MG PO TBEC
81.0000 mg | DELAYED_RELEASE_TABLET | Freq: Every day | ORAL | Status: DC
  Administered 2014-08-18: 81 mg via ORAL
  Filled 2014-08-18 (×2): qty 1

## 2014-08-18 MED ORDER — LACTATED RINGERS IV SOLN
INTRAVENOUS | Status: DC
  Administered 2014-08-18 (×2): via INTRAVENOUS

## 2014-08-18 MED ORDER — ONDANSETRON HCL 4 MG PO TABS: 4.0000 mg | ORAL_TABLET | Freq: Four times a day (QID) | ORAL | Status: DC | PRN

## 2014-08-18 MED ORDER — MAGNESIUM SULFATE 40 G IN LACTATED RINGERS - SIMPLE
2.0000 g/h | INTRAVENOUS | Status: DC
  Administered 2014-08-18: 2 g/h via INTRAVENOUS
  Filled 2014-08-18: qty 500

## 2014-08-18 MED ORDER — KETOROLAC TROMETHAMINE 30 MG/ML IJ SOLN
30.0000 mg | Freq: Once | INTRAMUSCULAR | Status: AC
  Administered 2014-08-18: 30 mg via INTRAMUSCULAR
  Filled 2014-08-18: qty 1

## 2014-08-18 NOTE — MAU Provider Note (Signed)
History     CSN: [id number]  Arrival date and time: 08/18/14 6629   First Provider Initiated Contact with Patient 08/18/14 0134      Chief Complaint  Patient presents with  . Drainage from Incision   HPI Felicia Foster 29 y.o. [id number] pt that delivered by c-section on 08/08/14 presents to MAU with complaint of incisional pain and infection.  She notes burning at site and feeling hot over her stomach more since staples were removed on 08/15/14.  She cannot see incision but FOB tells her it is looking worse.  She has not taken any pain medications since 08/16/14.   She is tearful - citing worries over high blood pressure noted on triage.  She reports no prior h/o or treatment for HTN.  (Chart review indicates gestational hypertension at time of c-section) GDM was diet controlled.  OB History    Gravida Para Term Preterm AB TAB SAB Ectopic Multiple Living   8 5 2 3 3 2 1  0 0 5      Past Medical History  Diagnosis Date  . Fibroids   . Mental disorder     bipolar  . Diabetes mellitus without complication   . Gestational diabetes     Past Surgical History  Procedure Laterality Date  . Cesarean section      x 4  . Dilation and curettage of uterus    . Cesarean section N/A 08/08/2014    Procedure: CESAREAN SECTION;  Surgeon: Truett Mainland, DO;  Location: [facility];  Service: Obstetrics;  Laterality: N/A;  repeat    Family History  Problem Relation Age of Onset  . Cancer Mother     History  Substance Use Topics  . Smoking status: Current Some Day Smoker -- 0.25 packs/day  . Smokeless tobacco: Never Used  . Alcohol Use: No    Allergies: No Known Allergies  Prescriptions prior to admission  Medication Sig Dispense Refill Last Dose  . bisacodyl (DULCOLAX) 10 MG suppository Place 1 suppository (10 mg total) rectally daily as needed for moderate constipation. (Patient not taking: Reported on 08/15/2014) 12 suppository 0 Not Taking  . clotrimazole-betamethasone (LOTRISONE)  cream Apply 1 application topically 2 (two) times daily. 30 g 0 Taking  . ferrous fumarate (HEMOCYTE - 106 MG FE) 325 (106 FE) MG TABS tablet Take 1 tablet (106 mg of iron total) by mouth 2 (two) times daily. 30 each 0 Taking  . ibuprofen (ADVIL,MOTRIN) 600 MG tablet Take 1 tablet (600 mg total) by mouth every 6 (six) hours. 50 tablet 0 Taking  . oxyCODONE-acetaminophen (PERCOCET/ROXICET) 5-325 MG per tablet Take 1 tablet by mouth every 4 (four) hours as needed (for pain scale less than 7). 50 tablet 0 Taking  . Prenatal Vit-Fe Fumarate-FA (PRENATAL MULTIVITAMIN) TABS tablet Take 1 tablet by mouth daily at 12 noon.   Taking    ROS Pertinent ROS in HPI Physical Exam   Blood pressure 162/102, pulse 86, temperature 100.1 F (37.8 C), resp. rate 18, height 5\' 6"  (1.676 m), weight 251 lb 12.8 oz (114.216 kg), last menstrual period 11/14/2013, not currently breastfeeding.  Physical Exam  Constitutional: She is oriented to person, place, and time. She appears well-developed and well-nourished. No distress.  HENT:  Head: Normocephalic and atraumatic.  Eyes: EOM are normal.  Neck: Normal range of motion.  Cardiovascular: Normal rate.   Respiratory: Effort normal and breath sounds normal. No respiratory distress.  GI:  Significantly tender surrounding vertical incision which extends to  umbilicus. Steri strips in place.   No noted erythema.  There is some drainage but appears to be increasingly closed without only 1-51mm open toward top of incision.  No warmth or odor noted.  Musculoskeletal: Normal range of motion.  LE edema is trace bilat  Neurological: She is alert and oriented to person, place, and time.  Skin: Skin is warm and dry.  Psychiatric: She has a normal mood and affect.    MAU Course  Procedures  MDM 0213am: Dr. Elonda Husky consulted and made aware of blood pressures, CBC and U/A results.  He advises for Toradol 30mg  IM at this time.  Will await CMP.  0320am: MD consulted with CMP  results.  Most recent BP 165/100.  Pain unimproved at 7/10 with Toradol and Percocet on board.  Will admit.   MD to enter orders.  Assessment and Plan  A:  1. Gestational hypertension without significant proteinuria, postpartum     P: Admit  Felicia Foster 08/18/2014, 1:36 AM

## 2014-08-18 NOTE — Progress Notes (Signed)
   08/18/14 2310  Vitals  BP (!) 160/102 mmHg  MAP (mmHg) 115  Pulse Rate 77  Oxygen Therapy  SpO2 100 %  MD paged and made aware of B/Ps'. Please see orders.

## 2014-08-18 NOTE — Progress Notes (Signed)
   08/18/14 2000  Vitals  BP (!) 150/103 mmHg  MAP (mmHg) 112  BP Location Right Arm  BP Method Automatic  Patient Position (if appropriate) Sitting (at the side of the bed eating supper)  Pulse Rate 84  PCA/Epidural/Spinal Assessment  Respiratory Pattern Regular;Unlabored;Symmetrical  Oxygen Therapy  SpO2 100 %  O2 Device Room Air  Pt sitting on the side of her bed eating supper. We will recheck B/P.

## 2014-08-18 NOTE — H&P (Signed)
HPI Felicia Foster 29 y.o. [id number] pt that delivered by c-section on 08/08/14 presents to MAU with complaint of incisional pain and infection. She notes burning at site and feeling hot over her stomach more since staples were removed on 08/15/14. She cannot see incision but FOB tells her it is looking worse. She has not taken any pain medications since 08/16/14.  She is tearful - citing worries over high blood pressure noted on triage. She reports no prior h/o or treatment for HTN. (Chart review indicates gestational hypertension at time of c-section) GDM was diet controlled.  OB History    Gravida Para Term Preterm AB TAB SAB Ectopic Multiple Living   8 5 2 3 3 2 1  0 0 5      Past Medical History  Diagnosis Date  . Fibroids   . Mental disorder     bipolar  . Diabetes mellitus without complication   . Gestational diabetes     Past Surgical History  Procedure Laterality Date  . Cesarean section      x 4  . Dilation and curettage of uterus    . Cesarean section N/A 08/08/2014    Procedure: CESAREAN SECTION; Surgeon: Truett Mainland, DO; Location: [facility]; Service: Obstetrics; Laterality: N/A; repeat    Family History  Problem Relation Age of Onset  . Cancer Mother     History  Substance Use Topics  . Smoking status: Current Some Day Smoker -- 0.25 packs/day  . Smokeless tobacco: Never Used  . Alcohol Use: No    Allergies: No Known Allergies  Prescriptions prior to admission  Medication Sig Dispense Refill Last Dose  . bisacodyl (DULCOLAX) 10 MG suppository Place 1 suppository (10 mg total) rectally daily as needed for moderate constipation. (Patient not taking: Reported on 08/15/2014) 12 suppository 0 Not Taking  . clotrimazole-betamethasone (LOTRISONE) cream Apply 1 application topically 2 (two) times daily. 30 g 0 Taking  . ferrous fumarate (HEMOCYTE  - 106 MG FE) 325 (106 FE) MG TABS tablet Take 1 tablet (106 mg of iron total) by mouth 2 (two) times daily. 30 each 0 Taking  . ibuprofen (ADVIL,MOTRIN) 600 MG tablet Take 1 tablet (600 mg total) by mouth every 6 (six) hours. 50 tablet 0 Taking  . oxyCODONE-acetaminophen (PERCOCET/ROXICET) 5-325 MG per tablet Take 1 tablet by mouth every 4 (four) hours as needed (for pain scale less than 7). 50 tablet 0 Taking  . Prenatal Vit-Fe Fumarate-FA (PRENATAL MULTIVITAMIN) TABS tablet Take 1 tablet by mouth daily at 12 noon.   Taking    ROS Pertinent ROS in HPI Physical Exam   Blood pressure 162/102, pulse 86, temperature 100.1 F (37.8 C), resp. rate 18, height 5\' 6"  (1.676 m), weight 251 lb 12.8 oz (114.216 kg), last menstrual period 11/14/2013, not currently breastfeeding.  Physical Exam  Constitutional: She is oriented to person, place, and time. She appears well-developed and well-nourished. No distress.  HENT:  Head: Normocephalic and atraumatic.  Eyes: EOM are normal.  Neck: Normal range of motion.  Cardiovascular: Normal rate.  Respiratory: Effort normal and breath sounds normal. No respiratory distress.  GI:  Significantly tender surrounding vertical incision which extends to umbilicus. Steri strips in place.  No noted erythema.  There is some drainage but appears to be increasingly closed without only 1-34mm open toward top of incision. No warmth or odor noted.  Musculoskeletal: Normal range of motion.  LE edema is trace bilat  Neurological: She is alert and oriented to person,  place, and time.  Skin: Skin is warm and dry.  Psychiatric: She has a normal mood and affect.    MAU Course  Procedures  MDM 0213am: Dr. Elonda Husky consulted and made aware of blood pressures, CBC and U/A results. He advises for Toradol 30mg  IM at this time. Will await CMP.  0320am: MD consulted with CMP results. Most recent BP 165/100. Pain unimproved at 7/10 with Toradol and  Percocet on board. Will admit. MD to enter orders.  Assessment and Plan  A:  1. Gestational hypertension without significant proteinuria, postpartum     P: Admit Magnesium prophylaxis  Incision is clean dry minimal superior drainage consistent with serous fluid Steri strips removed

## 2014-08-18 NOTE — MAU Note (Signed)
Had C/S 1/6. My incision feeling warm and draining puss. Increased pain in incision. Woke up last 2 nights sweating. This was my 5th c/s.

## 2014-08-19 DIAGNOSIS — O165 Unspecified maternal hypertension, complicating the puerperium: Secondary | ICD-10-CM | POA: Diagnosis present

## 2014-08-19 MED ORDER — AMLODIPINE BESYLATE 5 MG PO TABS: 5.0000 mg | ORAL_TABLET | Freq: Every day | ORAL | Status: DC

## 2014-08-19 NOTE — Discharge Instructions (Signed)

## 2014-08-19 NOTE — Progress Notes (Signed)
   08/19/14 0856  Vitals  BP (!) 150/94 mmHg  MAP (mmHg) 108  am Norvasc given po prior to discharge home.

## 2014-08-19 NOTE — Progress Notes (Signed)
Pt discharged home with SO.  VSS and pt without complaints. Discharge instructions and follow up care discussed-pt verbalized understanding.

## 2014-08-19 NOTE — Discharge Summary (Signed)
Obstetric Discharge Summary Reason for Admission: hypertension postpartum Prenatal Procedures: NST Intrapartum Procedures: cesarean: classical Postpartum Procedures: none Complications-Operative and Postpartum: postpartum hypertension  H&P by Florian Buff, MD at 08/18/2014 9:03 AM    Author: Florian Buff, MD Service: Obstetrics/Gynecology Author Type: Physician   Filed: 08/18/2014 9:04 AM Note Time: 08/18/2014 9:03 AM Status: Signed   Editor: Florian Buff, MD (Physician)     Expand All Collapse All    HPI Felicia Foster 29 y.o. [id number] pt that delivered by c-section on 08/08/14 presents to MAU with complaint of incisional pain and infection. She notes burning at site and feeling hot over her stomach more since staples were removed on 08/15/14. She cannot see incision but FOB tells her it is looking worse. She has not taken any pain medications since 08/16/14.  She is tearful - citing worries over high blood pressure noted on triage. She reports no prior h/o or treatment for HTN. (Chart review indicates gestational hypertension at time of c-section) GDM was diet controlled.  OB History    Gravida Para Term Preterm AB TAB SAB Ectopic Multiple Living   8 5 2 3 3 2 1  0 0 5      Past Medical History  Diagnosis Date  . Fibroids   . Mental disorder     bipolar  . Diabetes mellitus without complication   . Gestational diabetes     Past Surgical History  Procedure Laterality Date  . Cesarean section      x 4  . Dilation and curettage of uterus    . Cesarean section N/A 08/08/2014    Procedure: CESAREAN SECTION; Surgeon: Truett Mainland, DO; Location: [facility]; Service: Obstetrics; Laterality: N/A; repeat    Family History  Problem Relation Age of Onset  . Cancer Mother     History  Substance Use Topics  .  Smoking status: Current Some Day Smoker -- 0.25 packs/day  . Smokeless tobacco: Never Used  . Alcohol Use: No    Allergies: No Known Allergies  Prescriptions prior to admission  Medication Sig Dispense Refill Last Dose  . bisacodyl (DULCOLAX) 10 MG suppository Place 1 suppository (10 mg total) rectally daily as needed for moderate constipation. (Patient not taking: Reported on 08/15/2014) 12 suppository 0 Not Taking  . clotrimazole-betamethasone (LOTRISONE) cream Apply 1 application topically 2 (two) times daily. 30 g 0 Taking  . ferrous fumarate (HEMOCYTE - 106 MG FE) 325 (106 FE) MG TABS tablet Take 1 tablet (106 mg of iron total) by mouth 2 (two) times daily. 30 each 0 Taking  . ibuprofen (ADVIL,MOTRIN) 600 MG tablet Take 1 tablet (600 mg total) by mouth every 6 (six) hours. 50 tablet 0 Taking  . oxyCODONE-acetaminophen (PERCOCET/ROXICET) 5-325 MG per tablet Take 1 tablet by mouth every 4 (four) hours as needed (for pain scale less than 7). 50 tablet 0 Taking  . Prenatal Vit-Fe Fumarate-FA (PRENATAL MULTIVITAMIN) TABS tablet Take 1 tablet by mouth daily at 12 noon.   Taking    ROS Pertinent ROS in HPI Physical Exam   Blood pressure 162/102, pulse 86, temperature 100.1 F (37.8 C), resp. rate 18, height 5\' 6"  (1.676 m), weight 251 lb 12.8 oz (114.216 kg), last menstrual period 11/14/2013, not currently breastfeeding.  Physical Exam  Constitutional: She is oriented to person, place, and time. She appears well-developed and well-nourished. No distress.  HENT:  Head: Normocephalic and atraumatic.  Eyes: EOM are normal.  Neck: Normal range of motion.  Cardiovascular: Normal rate.  Respiratory: Effort normal and breath sounds normal. No respiratory distress.  GI:  Significantly tender surrounding vertical incision which extends to umbilicus. Steri strips in place.  No noted  erythema.  There is some drainage but appears to be increasingly closed without only 1-12mm open toward top of incision. No warmth or odor noted.  Musculoskeletal: Normal range of motion.  LE edema is trace bilat  Neurological: She is alert and oriented to person, place, and time.  Skin: Skin is warm and dry.  Psychiatric: She has a normal mood and affect.    MAU Course  Procedures  MDM 0213am: Dr. Elonda Husky consulted and made aware of blood pressures, CBC and U/A results. He advises for Toradol 30mg  IM at this time. Will await CMP.  0320am: MD consulted with CMP results. Most recent BP 165/100. Pain unimproved at 7/10 with Toradol and Percocet on board. Will admit. MD to enter orders.  Assessment and Plan  A:  1. Gestational hypertension without significant proteinuria, postpartum     P: Admit Magnesium prophylaxis  Incision is clean dry minimal superior drainage consistent with serous fluid Steri strips removed      HEMOGLOBIN  Date Value Ref Range Status  08/18/2014 9.0* 12.0 - 15.0 g/dL Final  01/16/2014 11.4 g/dL Final   HCT  Date Value Ref Range Status  08/18/2014 28.3* 36.0 - 46.0 % Final  01/16/2014 36 % Final    Physical Exam:  General: alert, cooperative and no distress Lochia: appropriate Uterine Fundus: firm Incision: healing well, no significant drainage, one staple removed DVT Evaluation: No evidence of DVT seen on physical exam.  Discharge Diagnoses: Preelampsia and postpartum  Discharge Information: Date: 08/19/2014 Activity: pelvic rest and no heavy lifting Diet: routine Medications: Norvasc Condition: stable Instructions: preeclampsia Discharge to: home Follow-up Information    Follow up with [facility] In 3 days.   Specialty:  Obstetrics and Gynecology   Contact information:   [place] [facility] [place] [phone]      Newborn Data: This patient has no babies on file. Home  with mother, baby had been discharged.  ARNOLD,JAMES 08/19/2014, 7:59 AM

## 2014-08-20 ENCOUNTER — Other Ambulatory Visit (HOSPITAL_COMMUNITY): Payer: Medicare Other

## 2014-08-21 ENCOUNTER — Encounter (HOSPITAL_COMMUNITY): Admission: RE | Payer: Self-pay | Source: Ambulatory Visit

## 2014-08-21 ENCOUNTER — Inpatient Hospital Stay (HOSPITAL_COMMUNITY)
Admission: RE | Admit: 2014-08-21 | Payer: Medicare Other | Source: Ambulatory Visit | Admitting: Obstetrics and Gynecology

## 2014-08-21 SURGERY — Surgical Case
Anesthesia: Regional

## 2014-08-22 ENCOUNTER — Encounter: Payer: Self-pay | Admitting: Obstetrics & Gynecology

## 2014-08-22 ENCOUNTER — Ambulatory Visit (INDEPENDENT_AMBULATORY_CARE_PROVIDER_SITE_OTHER): Payer: Medicare Other | Admitting: Obstetrics & Gynecology

## 2014-08-22 VITALS — BP 143/89 | HR 94 | Temp 98.8°F | Wt 246.4 lb

## 2014-08-22 DIAGNOSIS — O165 Unspecified maternal hypertension, complicating the puerperium: Secondary | ICD-10-CM

## 2014-08-22 NOTE — Progress Notes (Signed)
Subjective:     Patient ID: Felicia Foster, female   DOB: 09-Dec-1985, 29 y.o.   MRN: [id number]  [id number] No LMP recorded. 14 days PP, was hospitalized for HTN Scheduled Meds: Continuous Infusions: PRN Meds:.  Current outpatient prescriptions:  .  amLODipine (NORVASC) 5 MG tablet, Take 1 tablet (5 mg total) by mouth daily., Disp: 30 tablet, Rfl: 1 .  ibuprofen (ADVIL,MOTRIN) 600 MG tablet, Take 1 tablet (600 mg total) by mouth every 6 (six) hours., Disp: 50 tablet, Rfl: 0 .  oxyCODONE-acetaminophen (PERCOCET/ROXICET) 5-325 MG per tablet, Take 1 tablet by mouth every 4 (four) hours as needed (for pain scale less than 7)., Disp: 50 tablet, Rfl: 0 .  clotrimazole-betamethasone (LOTRISONE) cream, Apply 1 application topically 2 (two) times daily. (Patient not taking: Reported on 08/22/2014), Disp: 30 g, Rfl: 0 .  Prenatal Vit-Fe Fumarate-FA (PRENATAL MULTIVITAMIN) TABS tablet, Take 1 tablet by mouth daily at 12 noon., Disp: , Rfl:      Review of Systems  Gastrointestinal: Negative for abdominal pain.       Vertical incision, 1.5 cm minimal separation no infection       Objective:   Physical Exam  Constitutional: She appears well-developed. No distress.  Abdominal: Soft. There is no tenderness.  Psychiatric: She has a normal mood and affect. Her behavior is normal.  Vitals reviewed.      Assessment:     HTN borderline on Norvasc 2 weeks PP    Plan:     Continue Norvasc, RTC 4 weeks  Woodroe Mode, MD 08/22/2014

## 2014-08-22 NOTE — Patient Instructions (Signed)

## 2014-08-22 NOTE — Progress Notes (Signed)
Patient here today for wound check and follow up since her discharge for postpartum HTN. Started Norvasc on Sunday and reports taking it every day as prescribed but still c/o of chronic headaches.

## 2014-08-23 ENCOUNTER — Telehealth: Payer: Self-pay | Admitting: *Deleted

## 2014-08-23 NOTE — Telephone Encounter (Signed)
Pt called and stated that she had a wound check yesterday but is concerned about her navel having a smell. Patient said that she has some dried up drainage in that area. I advised that she gently clean the area and see if that helps the smell to improve. Patient is agreeable to that and will let us know if it doesn't improve.

## 2014-09-20 ENCOUNTER — Ambulatory Visit: Payer: Medicare Other | Admitting: Obstetrics & Gynecology

## 2014-10-31 ENCOUNTER — Ambulatory Visit: Payer: Medicare Other | Admitting: Obstetrics & Gynecology

## 2014-11-01 ENCOUNTER — Encounter: Payer: Self-pay | Admitting: Obstetrics & Gynecology

## 2014-11-01 ENCOUNTER — Ambulatory Visit (INDEPENDENT_AMBULATORY_CARE_PROVIDER_SITE_OTHER): Payer: Medicare Other | Admitting: Obstetrics & Gynecology

## 2014-11-01 NOTE — Progress Notes (Signed)
Patient ID: Felicia Foster, female   DOB: 1986/02/29, 29 y.o.   MRN: [id number] Subjective:post CS, unsure about BCM     Felicia Foster is a 29 y.o. female who presents for a postpartum visit. She is 6 weeks postpartum following a C/sectiondelivery 08/08/2014. I have fully reviewed the prenatal and intrapartum course. The delivery was at 37.3 gestational weeks. Outcome: repeat cesarean section, classical incision. Anesthesia: spinal. Postpartum course has been good. Baby's course has been good. Baby is feeding by bottle. Bleeding no bleeding. Bowel function is normal. Bladder function is normal. Patient is not sexually active. Contraception method is undecided. Postpartum depression screening: negative.  The following portions of the patient's history were reviewed and updated as appropriate: allergies, current medications, past family history, past medical history, past social history, past surgical history and problem list. Patient Active Problem List   Diagnosis Date Noted  . Status post cesarean section 08/08/2014  . Previous preterm delivery x 3 (33, 36 and 35 weeks), antepartum 06/18/2014  . Previous cesarean delivery x 4, antepartum 06/18/2014  . History of bipolar disorder 06/12/2014  . History of gestational diabetes 06/12/2014  . History of congenital or genetic condition 06/12/2014  . Limited prenatal care in third trimester   . 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 Gestational diabetes mellitus, antepartum     Review of Systems Pertinent items are noted in HPI.   Objective:    There were no vitals taken for this visit.  General:  alert, cooperative and no distress   Breasts:     Lungs:    Heart:     Abdomen: vertical incision well healed   Vulva:  not evaluated  Vagina: not evaluated  Cervix:     Corpus: not examined  Adnexa:  not evaluated  Rectal Exam: Not performed.        Assessment:     normal  postpartum exam. Pap smear  not done at today's visit.   Plan:    1. Contraception: consider IUD 2. Needs 2 hr GTT  3. Follow up  as needed.    Woodroe Mode, MD 11/01/2014

## 2014-11-01 NOTE — Patient Instructions (Signed)
Intrauterine Device Information An intrauterine device (IUD) is inserted into your uterus to prevent pregnancy. There are two types of IUDs available:   Copper IUD--This type of IUD is wrapped in copper wire and is placed inside the uterus. Copper makes the uterus and fallopian tubes produce a fluid that kills sperm. The copper IUD can stay in place for 10 years.  Hormone IUD--This type of IUD contains the hormone progestin (synthetic progesterone). The hormone thickens the cervical mucus and prevents sperm from entering the uterus. It also thins the uterine lining to prevent implantation of a fertilized egg. The hormone can weaken or kill the sperm that get into the uterus. One type of hormone IUD can stay in place for 5 years, and another type can stay in place for 3 years. Your health care provider will make sure you are a good candidate for a contraceptive IUD. Discuss with your health care provider the possible side effects.  ADVANTAGES OF AN INTRAUTERINE DEVICE  IUDs are highly effective, reversible, long acting, and low maintenance.   There are no estrogen-related side effects.   An IUD can be used when breastfeeding.   IUDs are not associated with weight gain.   The copper IUD works immediately after insertion.   The hormone IUD works right away if inserted within 7 days of your period starting. You will need to use a backup method of birth control for 7 days if the hormone IUD is inserted at any other time in your cycle.  The copper IUD does not interfere with your female hormones.   The hormone IUD can make heavy menstrual periods lighter and decrease cramping.   The hormone IUD can be used for 3 or 5 years.   The copper IUD can be used for 10 years. DISADVANTAGES OF AN INTRAUTERINE DEVICE  The hormone IUD can be associated with irregular bleeding patterns.   The copper IUD can make your menstrual flow heavier and more painful.   You may experience cramping and  vaginal bleeding after insertion.  Document Released: 06/23/2004 Document Revised: 03/22/2013 Document Reviewed: 01/08/2013 [facility] Patient Information 2015 [place], [place]. This information is not intended to replace advice given to you by your health care provider. Make sure you discuss any questions you have with your health care provider.

## 2014-11-01 NOTE — Progress Notes (Signed)
States she is not taking norvasc because she ran out. Does not have a PCP.

## 2014-12-28 ENCOUNTER — Emergency Department (HOSPITAL_COMMUNITY): Payer: Medicare Other

## 2014-12-28 ENCOUNTER — Encounter (HOSPITAL_COMMUNITY): Payer: Self-pay

## 2014-12-28 ENCOUNTER — Emergency Department (HOSPITAL_COMMUNITY)
Admission: EM | Admit: 2014-12-28 | Discharge: 2014-12-28 | Disposition: A | Discharge status: Home / Self Care | Payer: Medicare Other | Attending: Emergency Medicine | Admitting: Emergency Medicine

## 2014-12-28 DIAGNOSIS — Z8659 Personal history of other mental and behavioral disorders: Secondary | ICD-10-CM | POA: Diagnosis not present

## 2014-12-28 DIAGNOSIS — W228XXA Striking against or struck by other objects, initial encounter: Secondary | ICD-10-CM | POA: Diagnosis not present

## 2014-12-28 DIAGNOSIS — Z8632 Personal history of gestational diabetes: Secondary | ICD-10-CM | POA: Diagnosis not present

## 2014-12-28 DIAGNOSIS — Y9341 Activity, dancing: Secondary | ICD-10-CM | POA: Insufficient documentation

## 2014-12-28 DIAGNOSIS — S52615A Nondisplaced fracture of left ulna styloid process, initial encounter for closed fracture: Secondary | ICD-10-CM | POA: Diagnosis not present

## 2014-12-28 DIAGNOSIS — Z86018 Personal history of other benign neoplasm: Secondary | ICD-10-CM | POA: Insufficient documentation

## 2014-12-28 DIAGNOSIS — S5292XA Unspecified fracture of left forearm, initial encounter for closed fracture: Secondary | ICD-10-CM

## 2014-12-28 DIAGNOSIS — Z720 Tobacco use: Secondary | ICD-10-CM | POA: Insufficient documentation

## 2014-12-28 DIAGNOSIS — I10 Essential (primary) hypertension: Secondary | ICD-10-CM | POA: Diagnosis not present

## 2014-12-28 DIAGNOSIS — S52612A Displaced fracture of left ulna styloid process, initial encounter for closed fracture: Secondary | ICD-10-CM | POA: Diagnosis not present

## 2014-12-28 DIAGNOSIS — S52502A Unspecified fracture of the lower end of left radius, initial encounter for closed fracture: Secondary | ICD-10-CM | POA: Insufficient documentation

## 2014-12-28 DIAGNOSIS — S6992XA Unspecified injury of left wrist, hand and finger(s), initial encounter: Secondary | ICD-10-CM | POA: Diagnosis present

## 2014-12-28 DIAGNOSIS — E119 Type 2 diabetes mellitus without complications: Secondary | ICD-10-CM | POA: Diagnosis not present

## 2014-12-28 DIAGNOSIS — Y929 Unspecified place or not applicable: Secondary | ICD-10-CM | POA: Diagnosis not present

## 2014-12-28 DIAGNOSIS — Y999 Unspecified external cause status: Secondary | ICD-10-CM | POA: Diagnosis not present

## 2014-12-28 DIAGNOSIS — T1490XA Injury, unspecified, initial encounter: Secondary | ICD-10-CM

## 2014-12-28 MED ORDER — HYDROCODONE-ACETAMINOPHEN 5-325 MG PO TABS: 1.0000 | ORAL_TABLET | ORAL | Status: DC | PRN

## 2014-12-28 MED ORDER — HYDROCODONE-ACETAMINOPHEN 5-325 MG PO TABS
1.0000 | ORAL_TABLET | Freq: Once | ORAL | Status: AC
  Administered 2014-12-28: 1 via ORAL
  Filled 2014-12-28: qty 1

## 2014-12-28 NOTE — Discharge Instructions (Signed)
Read the information below.  Use the prescribed medication as directed.  Please discuss all new medications with your pharmacist.  Do not take additional tylenol while taking the prescribed pain medication to avoid overdose.  You may return to the Emergency Department at any time for worsening condition or any new symptoms that concern you.  If you develop uncontrolled pain, weakness or numbness of the extremity, severe discoloration of the skin, or you are unable to move your fingers, return to the ER for a recheck.      Cast or Splint Care Casts and splints support injured limbs and keep bones from moving while they heal. It is important to care for your cast or splint at home.  HOME CARE INSTRUCTIONS  Keep the cast or splint uncovered during the drying period. It can take 24 to 48 hours to dry if it is made of plaster. A fiberglass cast will dry in less than 1 hour.  Do not rest the cast on anything harder than a pillow for the first 24 hours.  Do not put weight on your injured limb or apply pressure to the cast until your health care provider gives you permission.  Keep the cast or splint dry. Wet casts or splints can lose their shape and may not support the limb as well. A wet cast that has lost its shape can also create harmful pressure on your skin when it dries. Also, wet skin can become infected.  Cover the cast or splint with a plastic bag when bathing or when out in the rain or snow. If the cast is on the trunk of the body, take sponge baths until the cast is removed.  If your cast does become wet, dry it with a towel or a blow dryer on the cool setting only.  Keep your cast or splint clean. Soiled casts may be wiped with a moistened cloth.  Do not place any hard or soft foreign objects under your cast or splint, such as cotton, toilet paper, lotion, or powder.  Do not try to scratch the skin under the cast with any object. The object could get stuck inside the cast. Also,  scratching could lead to an infection. If itching is a problem, use a blow dryer on a cool setting to relieve discomfort.  Do not trim or cut your cast or remove padding from inside of it.  Exercise all joints next to the injury that are not immobilized by the cast or splint. For example, if you have a long leg cast, exercise the hip joint and toes. If you have an arm cast or splint, exercise the shoulder, elbow, thumb, and fingers.  Elevate your injured arm or leg on 1 or 2 pillows for the first 1 to 3 days to decrease swelling and pain.It is best if you can comfortably elevate your cast so it is higher than your heart. SEEK MEDICAL CARE IF:   Your cast or splint cracks.  Your cast or splint is too tight or too loose.  You have unbearable itching inside the cast.  Your cast becomes wet or develops a soft spot or area.  You have a bad smell coming from inside your cast.  You get an object stuck under your cast.  Your skin around the cast becomes red or raw.  You have new pain or worsening pain after the cast has been applied. SEEK IMMEDIATE MEDICAL CARE IF:   You have fluid leaking through the cast.  You are  unable to move your fingers or toes.  You have discolored (blue or white), cool, painful, or very swollen fingers or toes beyond the cast.  You have tingling or numbness around the injured area.  You have severe pain or pressure under the cast.  You have any difficulty with your breathing or have shortness of breath.  You have chest pain. Document Released: 07/17/2000 Document Revised: 05/10/2013 Document Reviewed: 01/26/2013 [facility] Patient Information 2015 [place], [place]. This information is not intended to replace advice given to you by your health care provider. Make sure you discuss any questions you have with your health care provider.  Forearm Fracture Your caregiver has diagnosed you as having a broken bone (fracture) of the forearm. This is the part of your  arm between the elbow and your wrist. Your forearm is made up of two bones. These are the radius and ulna. A fracture is a break in one or both bones. A cast or splint is used to protect and keep your injured bone from moving. The cast or splint will be on generally for about 5 to 6 weeks, with individual variations. HOME CARE INSTRUCTIONS   Keep the injured part elevated while sitting or lying down. Keeping the injury above the level of your heart (the center of the chest). This will decrease swelling and pain.  Apply ice to the injury for 15-20 minutes, 03-04 times per day while awake, for 2 days. Put the ice in a plastic bag and place a thin towel between the bag of ice and your cast or splint.  If you have a plaster or fiberglass cast:  Do not try to scratch the skin under the cast using sharp or pointed objects.  Check the skin around the cast every day. You may put lotion on any red or sore areas.  Keep your cast dry and clean.  If you have a plaster splint:  Wear the splint as directed.  You may loosen the elastic around the splint if your fingers become numb, tingle, or turn cold or blue.  Do not put pressure on any part of your cast or splint. It may break. Rest your cast only on a pillow the first 24 hours until it is fully hardened.  Your cast or splint can be protected during bathing with a plastic bag. Do not lower the cast or splint into water.  Only take over-the-counter or prescription medicines for pain, discomfort, or fever as directed by your caregiver. SEEK IMMEDIATE MEDICAL CARE IF:   Your cast gets damaged or breaks.  You have more severe pain or swelling than you did before the cast.  Your skin or nails below the injury turn blue or gray, or feel cold or numb.  There is a bad smell or new stains and/or pus like (purulent) drainage coming from under the cast. MAKE SURE YOU:   Understand these instructions.  Will watch your condition.  Will get help right  away if you are not doing well or get worse. Document Released: 07/17/2000 Document Revised: 10/12/2011 Document Reviewed: 03/08/2008 [facility] Patient Information 2015  [place], [place]. This information is not intended to replace advice given to you by your health care provider. Make sure you discuss any questions you have with your health care provider.   Emergency Department Resource Guide 1) Find a Doctor and Pay Out of Pocket Although you won't have to find out who is covered by your insurance plan, it is a good idea to ask around and get  recommendations. You will then need to call the office and see if the doctor you have chosen will accept you as a new patient and what types of options they offer for patients who are self-pay. Some doctors offer discounts or will set up payment plans for their patients who do not have insurance, but you will need to ask so you aren't surprised when you get to your appointment.  2) Contact Your Local Health Department Not all health departments have doctors that can see patients for sick visits, but many do, so it is worth a call to see if yours does. If you don't know where your local health department is, you can check in your phone book. The CDC also has a tool to help you locate your state's health department, and many state websites also have listings of all of their local health departments.  3) Find a [facility] If your illness is not likely to be very severe or complicated, you may want to try a walk in clinic. These are popping up all over the country in pharmacies, drugstores, and shopping centers. They're usually staffed by nurse practitioners or physician assistants that have been trained to treat common illnesses and complaints. They're usually fairly quick and inexpensive. However, if you have serious medical issues or chronic medical problems, these are probably not your best option.  No Primary Care Doctor: - Call Health Connect at  [phone] -  they can help you locate a primary care doctor that  accepts your insurance, provides certain services, etc. - Physician Referral Service- [phone]  Chronic Pain Problems: Organization         Address  Phone   Notes  [facility]  [phone] Patients need to be referred by their primary care doctor.   Medication Assistance: Organization         Address  Phone   Notes  [facility] Medication [facility] [address] [place], [place] [address] [phone] --Must be a resident of [facility] -- Must have NO insurance coverage whatsoever (no Medicaid/ Medicare, etc.) -- The pt. MUST have a primary care doctor that directs their care regularly and follows them in the community   MedAssist  [phone]   [employer]  [phone]    Agencies that provide inexpensive medical care: Organization         Address  Phone   Notes  [place]  [phone]   [facility]    [phone]   [facility] [place], [place] [address] [phone]   [place] [address] [place] [phone]   Planned Parenthood    [phone]   [facility]    [phone]   [place] and [place] [address], [place] Phone:  [phone], Fax:  [phone] Hours of Operation:  9 am - 6 pm, M-F.  Also accepts Medicaid/Medicare and self-pay.  [facility] for [place] [address], [address], [place] Phone: [phone], Fax: [phone]. Hours of Operation:  8:30 am - 5:30 pm, M-F.  Also accepts Medicaid and self-pay.  [facility] [place] [address], [place] Phone: [phone]   [place], [place], [place] [phone], Ext. [phone] Mondays & Thursdays: 7-9 AM.  First 15 patients are seen on a first come,  first serve basis.    [place] Providers:  Organization         Address  Phone   Notes  [facility] [address], [place] [phone] Also accepts self-pay patients.  [facility] [address], [place]  [phone]   [place], [address], [place] [phone]   [facility] [address], [place] [phone]   Lucianne Lei [address], [place]   [phone] Only accepts [place] Access [place] patients after they have their name applied to their card.   Self-Pay (no insurance) in [facility]:  Organization         Address  Phone   Notes  Sickle Cell Patients, [facility] [place] [phone]   [facility] [place] [phone]   [facility] [place]  [place], [place], [place] [phone]   [facility]/Dr. Osei-Bonsu  [address] [place]ier[place] or [address], [place] [phone] Phone number for both [place] and [place] locations is the same.  Urgent Medical and [facility] [address], [place] [phone]   [facility] [address] [place] or [address] [phone]   [facility] [address], [place] [phone], phone; [phone], fax Sees patients 1st and 3rd Saturday of every month.  Must not qualify for public or private insurance (i.e. Medicaid, Medicare, [place] Health Choice, Veterans' Benefits)  Household income should be no more than 200% of the poverty level The clinic cannot treat you if you are pregnant or think you are pregnant  Sexually transmitted diseases are not treated at the clinic.    Dental Care: Organization         Address  Phone  Notes  [facility] Department of [facility] [place] [phone] Accepts children up to age 37 who are enrolled in [place] or [place]; pregnant women with a Medicaid card; and children who have applied for Medicaid or [place] Health Choice, but were declined, whose parents can pay a reduced fee at time of service.  [facility] Department of [facility]  [address], [place] [phone] Accepts children up to age 46 who are enrolled in [place] or [place]; pregnant women with a Medicaid card; and children who have applied for Medicaid or [place] Health Choice, but were declined, whose parents can pay a reduced fee at time of service.  [place] Adult Dental Access PROGRAM  [place] [phone] Patients are seen by appointment only. Walk-ins are not accepted. [place] will see patients 3 years of age and older. Monday - Tuesday (8am-5pm) Most Wednesdays (8:30-5pm) $30 per visit, cash only  [facility] Adult Dental Access PROGRAM  [address], [facility] [phone] Patients are seen by appointment only. Walk-ins are not accepted. [place] will see patients 76 years of age and older. One Wednesday Evening (Monthly: Volunteer Based).  $30 per visit, cash only  [place]  [phone] for adults; Children under age 53, call Graduate Pediatric Dentistry at [phone]. Children aged 43-14, please  call [phone] to request a pediatric application.  Dental services are provided in all areas of dental care including fillings, crowns and bridges, complete and partial dentures, implants, gum treatment, root canals, and extractions. Preventive care is also provided. Treatment is provided to both adults and children. Patients are selected via a lottery and there is often a waiting list.   [facility] [address] [place]  [phone] [email or web address]   Rescue  [facility] [address] [place], [place] [phone], Ext. [phone] Second and Fourth Thursday of each month, opens at 6:30 AM; Clinic ends at 9 AM.  Patients are seen on a first-come first-served basis, and a limited number are seen during each clinic.   [facility]  [address] Hillard Danker [place], [place] [phone]   Eligibility Requirements You must have lived in [place], [place], or [place] counties for at least the last three months.   You cannot be eligible for state or federal sponsored [employer], including [employer], [place], or [employer].   You generally cannot be eligible for healthcare insurance through your employer.    How to apply: Eligibility screenings are held every Tuesday and Wednesday afternoon from 1:00 pm until 4:00 pm. You do not need an appointment for the interview!  [facility] [address], [place], [place]   [place]  [place] Department  [place]  [phone]    Behavioral Health Resources in the Community: Intensive Outpatient Programs Organization         Address  Phone  Notes  [place]. [address], [place], [place] [phone]   [facility]patient [address] [place], [place]   ADS: Alcohol & Drug Svcs [address], [place], [place]   [place] [address]  [place], [place] or [phone]   Substance Abuse Resources Organization         Address  Phone  Notes  Alcohol and Drug Services  [phone]   [place]  [phone]   The [place]   Chinita Pester  [phone]   Residential & Outpatient Substance Abuse Program  [phone]   Psychological Services Organization         Address  Phone  Notes  [facility] [place]   [place]  [phone]   [place] [address] [place] or [phone]    Mobile Crisis Teams Organization         Address  Phone  Notes  [facility], Mobile Crisis Care Unit  [phone]   Assertive Psychotherapeutic Services  [address] [place], [place]   [place] [address], [place]  [place] [phone]    Self-Help/Support Groups Organization         Address  Phone             Notes  [place]. of [place] - variety of support groups  [place] Call for more information  Narcotics Anonymous (NA), [facility] [address], [employer] [place]  2 meetings at this location   Special educational needs teacher         Address  Phone  Notes  ASAP Residential Treatment [place],     [place]  [phone]   [facility]  [address], [place] [phone], [place], [place]   Magazine [place], [place] [phone] Admissions: 8am-3pm M-F  Incentives Substance [place] [address],    [place], [place] [phone]   [facility] [address] [place], [place], [place]   [facility] [address]  [place], [place]   Insight Programs - Intensive Outpatient [address], [place], [place]   [facility] ([place] [place].,  [place], [place] [phone] or [phone]   [facility]idential Treatment Services (RTS) [address], [place], [place] Accepts Medicaid  [facility] [place] [address],  [place] [phone] Substance Abuse/Addiction Treatment   [facility] Organization         Address  Phone  Notes  [facility]  [phone]   Domenic Schwab, PhD [address] Arlis Porta [place], [place]   [phone] or  [phone]   [place], [place] [phone]   [facility] [address], [place], [place] [phone] Insurance/Medicaid/sponsorship through [facility] and Families [address], [address] [place]                                    [place], [place] [phone] [facility] [address], [place] [phone]    Dr. Adele Schilder  [phone]   [facility] [place] Dept. 1) [address] [place] 2) [place] 3)  [address], [place] [phone]  [phone]   [place] [phone] or [phone] (After Hours)

## 2014-12-28 NOTE — ED Notes (Signed)
Pt reports her daughter backed up into her left wrist and ever since she has been in pain. Left wrist appears swollen. + radial pulse, sensation and able to wiggle fingers.

## 2014-12-28 NOTE — Progress Notes (Signed)
Orthopedic Tech Progress Note Patient Details:  Felicia Foster 1985-10-03 [id number] Applied fiberglass sugar tong splint to LUE.  Pulses, sensation, motion intact before and after splinting.  Capillary refill less than 2 seconds before and after splinting.  Placed splinted LUE in arm sling. Ortho Devices Type of Ortho Device: Sugartong splint Ortho Device/Splint Location: LUE Ortho Device/Splint Interventions: Application   Darrol Poke 12/28/2014, 10:37 PM

## 2014-12-28 NOTE — ED Provider Notes (Signed)
CSN: [id number]     Arrival date & time 12/28/14  2023 History  This chart was scribed for non-physician practitioner Clayton Bibles, PA-C, working with Veryl Speak, MD by Meriel Pica, ED Scribe. This patient was seen in room TR09C/TR09C and the patient's care was started at 8:47 PM.   Chief Complaint  Patient presents with  . Wrist Pain    The history is provided by the patient. No language interpreter was used.    HPI Comments: Felicia Foster is a 29 y.o. female, with no chronic medical history, who presents to the Emergency Department complaining of constant, moderate, achy left wrist pain onset approximately 20 minutes ago. She states swelling of her wrist as an associated symptom. Pt reports her pain is exacerbated by movement. She tried ice with no relief. Pt states her daughter was dancing and jumping and accidentally hit her hand and she heard a 'crack' over the top of her left wrist. She denies fevers, chills, recent infections, other arthralgias, CP, rashes and SOB. Pt denies possibility of pregnancy.   Past Medical History  Diagnosis Date  . Fibroids   . Mental disorder     bipolar  . Diabetes mellitus without complication   . Gestational diabetes   . Hypertension    Past Surgical History  Procedure Laterality Date  . Cesarean section      x 4  . Dilation and curettage of uterus    . Cesarean section N/A 08/08/2014    Procedure: CESAREAN SECTION;  Surgeon: Truett Mainland, DO;  Location: [facility];  Service: Obstetrics;  Laterality: N/A;  repeat   Family History  Problem Relation Age of Onset  . Cancer Mother    History  Substance Use Topics  . Smoking status: Current Some Day Smoker -- 0.25 packs/day    Types: Cigarettes  . Smokeless tobacco: Never Used  . Alcohol Use: No   OB History    Gravida Para Term Preterm AB TAB SAB Ectopic Multiple Living   8 5 2 3 3 2 1  0 0 5     Review of Systems  Constitutional: Negative for fever and chills.  Respiratory:  Negative for shortness of breath.   Cardiovascular: Negative for chest pain.  Gastrointestinal: Negative for nausea and vomiting.  Musculoskeletal: Positive for joint swelling and arthralgias.  Skin: Negative for rash and wound.  Allergic/Immunologic: Negative for immunocompromised state.  Neurological: Negative for weakness and numbness.  Hematological: Does not bruise/bleed easily.  Psychiatric/Behavioral: Negative for self-injury.      Allergies  Review of patient's allergies indicates no known allergies.  Home Medications   Prior to Admission medications   Medication Sig Start Date End Date Taking? Authorizing Provider  amLODipine (NORVASC) 5 MG tablet Take 1 tablet (5 mg total) by mouth daily. Patient not taking: Reported on 11/01/2014 08/19/14   Woodroe Mode, MD  ibuprofen (ADVIL,MOTRIN) 600 MG tablet Take 1 tablet (600 mg total) by mouth every 6 (six) hours. Patient not taking: Reported on 11/01/2014 08/11/14   Myrtis Ser, CNM  Prenatal Vit-Fe Fumarate-FA (PRENATAL MULTIVITAMIN) TABS tablet Take 1 tablet by mouth daily at 12 noon.    Historical Provider, MD   There were no vitals taken for this visit.   Physical Exam  Constitutional: She appears well-developed and well-nourished. No distress.  HENT:  Head: Normocephalic and atraumatic.  Neck: Neck supple.  Pulmonary/Chest: Effort normal.  Musculoskeletal: She exhibits tenderness.  LEFT upper extremity: Diffuse tenderness throughout her wrist ventral  and dorsal radial pulse intact No bony tenderness throughout hand Sensation intact distally  Capillary refill less than 2 seconds throughout  Weakened grip strength of left hand secondary to pain.   No skin changes.    Neurological: She is alert.  Skin: She is not diaphoretic.  Nursing note and vitals reviewed.   ED Course  Procedures    COORDINATION OF CARE: 8:52 PM Discussed treatment plan which includes ordering an X-ray of wrist with pt. Pt acknowledges  and agrees to plan.   Labs Review Labs Reviewed - No data to display  Imaging Review Dg Wrist Complete Left  12/28/2014   CLINICAL DATA:  Hit in left arm, and heard a pop. Left wrist pain. Initial encounter.  EXAM: LEFT WRIST - COMPLETE 3+ VIEW  COMPARISON:  None.  FINDINGS: There is a mildly comminuted near-horizontal fracture through the distal radial metaphysis, with slight displacement of a small fragment at the ulnar aspect of the distal radius. This appears to extend to the radiocarpal joint.  A nondisplaced ulnar styloid fracture is also noted. These fractures are somewhat unusual for the patient's described mechanism of injury.  The carpal rows appear grossly intact, and demonstrate normal alignment. No significant angulation is seen. Mild soft tissue swelling is noted about the fracture sites.  IMPRESSION: Mildly comminuted near horizontal fracture through the distal radial metaphysis, with slight displacement of a small fragment at the ulnar aspect of the distal radius. This appears to extend to the radiocarpal joint. Nondisplaced ulnar styloid fracture also noted. These fractures are somewhat unusual for the patient's described mechanism of injury.   Electronically Signed   By: Garald Balding M.D.   On: 12/28/2014 21:14     EKG Interpretation None       Discussed injury with pt alone and she denies any other trauma or injury to her wrist other than what she previously described.    MDM   Final diagnoses:  Injury  Fracture of distal forearm, left, closed, initial encounter    Afebrile, nontoxic patient with injury to her left wrist when her daughter accidentally bumped into it.  Denies other injury.  Neurovascularly intact.   Xray distal radius and ulnar styloid fractures.  Placed in sugar tong short arm splint.  D/C home with norco, hand surgery follow up.  Discussed result, findings, treatment, and follow up  with patient.  Pt given return precautions.  Pt verbalizes understanding  and agrees with plan.       I personally performed the services described in this documentation, which was scribed in my presence. The recorded information has been reviewed and is accurate.    Clayton Bibles, PA-C 12/28/14 2253  Veryl Speak, MD 12/29/14 [phone]

## 2015-02-01 ENCOUNTER — Encounter (HOSPITAL_COMMUNITY): Payer: Self-pay | Admitting: Emergency Medicine

## 2015-02-01 ENCOUNTER — Emergency Department (INDEPENDENT_AMBULATORY_CARE_PROVIDER_SITE_OTHER)
Admission: EM | Admit: 2015-02-01 | Discharge: 2015-02-01 | Disposition: A | Discharge status: Home / Self Care | Payer: Medicare Other | Source: Home / Self Care

## 2015-02-01 DIAGNOSIS — S62102G Fracture of unspecified carpal bone, left wrist, subsequent encounter for fracture with delayed healing: Secondary | ICD-10-CM | POA: Diagnosis not present

## 2015-02-01 DIAGNOSIS — S62102A Fracture of unspecified carpal bone, left wrist, initial encounter for closed fracture: Secondary | ICD-10-CM | POA: Diagnosis not present

## 2015-02-01 MED ORDER — TRAMADOL HCL 50 MG PO TABS: 50.0000 mg | ORAL_TABLET | Freq: Four times a day (QID) | ORAL | Status: DC | PRN

## 2015-02-01 MED ORDER — HYDROMORPHONE HCL 1 MG/ML IJ SOLN
INTRAMUSCULAR | Status: AC
  Filled 2015-02-01: qty 2

## 2015-02-01 MED ORDER — HYDROMORPHONE HCL 1 MG/ML IJ SOLN: 1.0000 mg | Freq: Once | INTRAMUSCULAR | Status: DC

## 2015-02-01 NOTE — Discharge Instructions (Signed)
YOU MUST FOLLOW UP WITH THE ORTHOPEDIST NEXT WEEK.     Cast or Splint Care Casts and splints support injured limbs and keep bones from moving while they heal. It is important to care for your cast or splint at home.  HOME CARE INSTRUCTIONS  Keep the cast or splint uncovered during the drying period. It can take 24 to 48 hours to dry if it is made of plaster. A fiberglass cast will dry in less than 1 hour.  Do not rest the cast on anything harder than a pillow for the first 24 hours.  Do not put weight on your injured limb or apply pressure to the cast until your health care provider gives you permission.  Keep the cast or splint dry. Wet casts or splints can lose their shape and may not support the limb as well. A wet cast that has lost its shape can also create harmful pressure on your skin when it dries. Also, wet skin can become infected.  Cover the cast or splint with a plastic bag when bathing or when out in the rain or snow. If the cast is on the trunk of the body, take sponge baths until the cast is removed.  If your cast does become wet, dry it with a towel or a blow dryer on the cool setting only.  Keep your cast or splint clean. Soiled casts may be wiped with a moistened cloth.  Do not place any hard or soft foreign objects under your cast or splint, such as cotton, toilet paper, lotion, or powder.  Do not try to scratch the skin under the cast with any object. The object could get stuck inside the cast. Also, scratching could lead to an infection. If itching is a problem, use a blow dryer on a cool setting to relieve discomfort.  Do not trim or cut your cast or remove padding from inside of it.  Exercise all joints next to the injury that are not immobilized by the cast or splint. For example, if you have a long leg cast, exercise the hip joint and toes. If you have an arm cast or splint, exercise the shoulder, elbow, thumb, and fingers.  Elevate your injured arm or leg on 1  or 2 pillows for the first 1 to 3 days to decrease swelling and pain.It is best if you can comfortably elevate your cast so it is higher than your heart. SEEK MEDICAL CARE IF:   Your cast or splint cracks.  Your cast or splint is too tight or too loose.  You have unbearable itching inside the cast.  Your cast becomes wet or develops a soft spot or area.  You have a bad smell coming from inside your cast.  You get an object stuck under your cast.  Your skin around the cast becomes red or raw.  You have new pain or worsening pain after the cast has been applied. SEEK IMMEDIATE MEDICAL CARE IF:   You have fluid leaking through the cast.  You are unable to move your fingers or toes.  You have discolored (blue or white), cool, painful, or very swollen fingers or toes beyond the cast.  You have tingling or numbness around the injured area.  You have severe pain or pressure under the cast.  You have any difficulty with your breathing or have shortness of breath.  You have chest pain. Document Released: 07/17/2000 Document Revised: 05/10/2013 Document Reviewed: 01/26/2013 [facility] Patient Information 2015 [place], [place]. This information  is not intended to replace advice given to you by your health care provider. Make sure you discuss any questions you have with your health care provider.
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
## 2015-02-01 NOTE — Progress Notes (Signed)
Orthopedic Tech Progress Note Patient Details:  Felicia Foster 1985/12/13 [id number]  Ortho Devices Type of Ortho Device: Ace wrap, Arm sling, Sugartong splint Ortho Device/Splint Location: LUE Ortho Device/Splint Interventions: Ordered, Application   Braulio Bosch 02/01/2015, 9:12 PM

## 2015-02-01 NOTE — ED Notes (Signed)
Ortho Tech has been paged
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
## 2015-02-01 NOTE — ED Notes (Signed)
Pt declined Dilaudid... Notified Bard Herbert, NP

## 2015-06-24 ENCOUNTER — Encounter (HOSPITAL_COMMUNITY): Payer: Self-pay | Admitting: *Deleted

## 2015-06-24 ENCOUNTER — Emergency Department (HOSPITAL_COMMUNITY)
Admission: EM | Admit: 2015-06-24 | Discharge: 2015-06-25 | Disposition: A | Discharge status: Home / Self Care | Payer: Medicare Other | Attending: Emergency Medicine | Admitting: Emergency Medicine

## 2015-06-24 DIAGNOSIS — F17210 Nicotine dependence, cigarettes, uncomplicated: Secondary | ICD-10-CM | POA: Diagnosis not present

## 2015-06-24 DIAGNOSIS — S0012XA Contusion of left eyelid and periocular area, initial encounter: Secondary | ICD-10-CM | POA: Diagnosis not present

## 2015-06-24 DIAGNOSIS — Z8659 Personal history of other mental and behavioral disorders: Secondary | ICD-10-CM | POA: Diagnosis not present

## 2015-06-24 DIAGNOSIS — S022XXA Fracture of nasal bones, initial encounter for closed fracture: Secondary | ICD-10-CM | POA: Insufficient documentation

## 2015-06-24 DIAGNOSIS — Z86018 Personal history of other benign neoplasm: Secondary | ICD-10-CM | POA: Diagnosis not present

## 2015-06-24 DIAGNOSIS — Y9289 Other specified places as the place of occurrence of the external cause: Secondary | ICD-10-CM | POA: Insufficient documentation

## 2015-06-24 DIAGNOSIS — I10 Essential (primary) hypertension: Secondary | ICD-10-CM | POA: Insufficient documentation

## 2015-06-24 DIAGNOSIS — S0011XA Contusion of right eyelid and periocular area, initial encounter: Secondary | ICD-10-CM | POA: Diagnosis not present

## 2015-06-24 DIAGNOSIS — S0993XA Unspecified injury of face, initial encounter: Secondary | ICD-10-CM | POA: Diagnosis present

## 2015-06-24 DIAGNOSIS — E119 Type 2 diabetes mellitus without complications: Secondary | ICD-10-CM | POA: Insufficient documentation

## 2015-06-24 DIAGNOSIS — Y9389 Activity, other specified: Secondary | ICD-10-CM | POA: Diagnosis not present

## 2015-06-24 DIAGNOSIS — Y999 Unspecified external cause status: Secondary | ICD-10-CM | POA: Diagnosis not present

## 2015-06-24 DIAGNOSIS — Z3202 Encounter for pregnancy test, result negative: Secondary | ICD-10-CM | POA: Insufficient documentation
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
## 2015-06-25 ENCOUNTER — Emergency Department (HOSPITAL_COMMUNITY): Payer: Medicare Other

## 2015-06-25 DIAGNOSIS — S022XXA Fracture of nasal bones, initial encounter for closed fracture: Secondary | ICD-10-CM | POA: Diagnosis not present

## 2015-06-25 LAB — POC URINE PREG, ED: Preg Test, Ur: NEGATIVE

## 2015-06-25 MED ORDER — HYDROCODONE-ACETAMINOPHEN 5-325 MG PO TABS: 1.0000 | ORAL_TABLET | ORAL | Status: DC | PRN
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
## 2017-04-22 ENCOUNTER — Ambulatory Visit: Payer: Medicare Other

## 2017-04-23 ENCOUNTER — Encounter: Payer: Self-pay | Admitting: Family Medicine

## 2017-04-23 ENCOUNTER — Other Ambulatory Visit (HOSPITAL_COMMUNITY)
Admission: RE | Admit: 2017-04-23 | Discharge: 2017-04-23 | Disposition: A | Discharge status: Home / Self Care | Payer: Medicare Other | Source: Ambulatory Visit | Attending: Obstetrics & Gynecology | Admitting: Obstetrics & Gynecology

## 2017-04-23 ENCOUNTER — Ambulatory Visit (INDEPENDENT_AMBULATORY_CARE_PROVIDER_SITE_OTHER): Payer: Medicare Other | Admitting: *Deleted

## 2017-04-23 DIAGNOSIS — Z113 Encounter for screening for infections with a predominantly sexual mode of transmission: Secondary | ICD-10-CM

## 2017-04-23 DIAGNOSIS — N898 Other specified noninflammatory disorders of vagina: Secondary | ICD-10-CM | POA: Diagnosis not present

## 2017-04-23 MED ORDER — FLUCONAZOLE 150 MG PO TABS: 150.0000 mg | ORAL_TABLET | Freq: Every day | ORAL | 0 refills | Status: DC

## 2017-04-23 NOTE — Progress Notes (Signed)
Pt in for self swab due to suspected yeast infection. Pt obtained swab. Rx given for diflucan. Pt advised that if anything additional shows on swab we will call her to change treatment.

## 2017-04-26 LAB — CERVICOVAGINAL ANCILLARY ONLY
Bacterial vaginitis: POSITIVE — AB
Candida vaginitis: NEGATIVE
Chlamydia: NEGATIVE
NEISSERIA GONORRHEA: NEGATIVE
TRICH (WINDOWPATH): NEGATIVE

## 2017-04-28 ENCOUNTER — Other Ambulatory Visit: Payer: Self-pay

## 2017-04-28 MED ORDER — METRONIDAZOLE 500 MG PO TABS: 500.0000 mg | ORAL_TABLET | Freq: Two times a day (BID) | ORAL | 0 refills | Status: DC

## 2017-04-28 NOTE — Telephone Encounter (Signed)
Patient has BV per results Flagyl will be called into her pharamcy

## 2018-03-30 ENCOUNTER — Ambulatory Visit (INDEPENDENT_AMBULATORY_CARE_PROVIDER_SITE_OTHER): Payer: Medicare Other | Admitting: Certified Nurse Midwife

## 2018-03-30 ENCOUNTER — Other Ambulatory Visit (HOSPITAL_COMMUNITY)
Admission: RE | Admit: 2018-03-30 | Discharge: 2018-03-30 | Disposition: A | Discharge status: Home / Self Care | Payer: Medicare Other | Source: Ambulatory Visit | Attending: Certified Nurse Midwife | Admitting: Certified Nurse Midwife

## 2018-03-30 VITALS — BP 184/124 | HR 86 | Wt 274.0 lb

## 2018-03-30 DIAGNOSIS — B009 Herpesviral infection, unspecified: Secondary | ICD-10-CM | POA: Diagnosis not present

## 2018-03-30 DIAGNOSIS — N9489 Other specified conditions associated with female genital organs and menstrual cycle: Secondary | ICD-10-CM | POA: Diagnosis not present

## 2018-03-30 DIAGNOSIS — Z113 Encounter for screening for infections with a predominantly sexual mode of transmission: Secondary | ICD-10-CM | POA: Diagnosis not present

## 2018-03-30 DIAGNOSIS — Z202 Contact with and (suspected) exposure to infections with a predominantly sexual mode of transmission: Secondary | ICD-10-CM | POA: Insufficient documentation

## 2018-03-30 DIAGNOSIS — I10 Essential (primary) hypertension: Secondary | ICD-10-CM

## 2018-03-30 MED ORDER — AMLODIPINE BESYLATE 5 MG PO TABS: 5.0000 mg | ORAL_TABLET | Freq: Every day | ORAL | 3 refills | Status: DC

## 2018-03-30 MED ORDER — VALACYCLOVIR HCL 1 G PO TABS: 1000.0000 mg | ORAL_TABLET | Freq: Two times a day (BID) | ORAL | 3 refills | Status: DC
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
## 2018-03-30 NOTE — Progress Notes (Signed)
GYNECOLOGY  ENCOUNTER NOTE  Subjective:   Felicia Foster is a 32 y.o. [id number] female here for a problem visit.  Current complaints: labial bump and pelvic pain. She reports symptoms have been occurring since 8/23 after she had intercourse. She reports pain is specific to her right side. She denies changes in partners. She reports hx of Chlamydia and HSV as a teen at age 33. She denies having any outbreaks or being on medication since she was 33. Rates pain 10/10- has not taken any medication for pain.    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 Diabetes mellitus without complication ([place])   . Fibroids   . Gestational diabetes   . Hypertension   . Mental disorder    bipolar    Past Surgical History:  Procedure Laterality Date  . CESAREAN SECTION     x 4  . CESAREAN SECTION N/A 08/08/2014   Procedure: CESAREAN SECTION;  Surgeon: Truett Mainland, DO;  Location: [facility];  Service: Obstetrics;  Laterality: N/A;  repeat  . DILATION AND CURETTAGE OF UTERUS      Current Outpatient Medications on File Prior to Visit  Medication Sig Dispense Refill  . fluconazole (DIFLUCAN) 150 MG tablet Take 1 tablet (150 mg total) by mouth daily. (Patient not taking:  Reported on 03/30/2018) 1 tablet 0  . HYDROcodone-acetaminophen (NORCO/VICODIN) 5-325 MG tablet Take 1-2 tablets by mouth every 4 (four) hours as needed for moderate pain. (Patient not taking: Reported on 03/30/2018) 15 tablet 0  . metroNIDAZOLE (FLAGYL) 500 MG tablet Take 1 tablet (500 mg total) by mouth 2 (two) times daily. (Patient not taking: Reported on 03/30/2018) 14 tablet 0   No current facility-administered medications on file prior to visit.     No Known Allergies  Social History:  reports that she has been smoking cigarettes. She has been smoking about 0.25 packs per day. She has never used smokeless tobacco. She reports that she drinks alcohol. She reports that she does not use drugs.  Family History  Problem Relation Age of Onset  . Cancer Mother     The following portions of the patient's history were reviewed and updated as appropriate: allergies, current medications, past family history, past medical history, past social history, past surgical history and problem list.  Review of Systems Pertinent items noted in HPI and remainder of comprehensive ROS otherwise negative.   Objective:    Vitals:   03/30/18 1716 03/30/18 1721  BP: (!) 161/108 (!) 184/124  Pulse: 86 86  Weight: 274 lb (124.3 kg)     CONSTITUTIONAL: Well-developed, morbid obese female in no acute distress.  HENT:  Normocephalic, atraumatic NECK: Normal range of motion, supple, no masses.  Normal thyroid.  SKIN: Skin is warm and dry. No rash noted. Not diaphoretic. No erythema. No pallor. MUSCULOSKELETAL: Normal range of motion. No tenderness.  No cyanosis, clubbing, or edema.  2+ distal pulses. NEUROLOGIC: Alert and oriented to person, place, and time. Normal reflexes, muscle tone coordination. PSYCHIATRIC: Normal mood and affect. Normal behavior. Normal judgment and thought content. CARDIOVASCULAR: Normal heart rate noted, regular rhythm RESPIRATORY: Clear to auscultation bilaterally. Effort and breath  sounds normal, no problems with respiration noted. ABDOMEN: Soft, normal bowel sounds, no distention noted.  No tenderness, rebound or guarding.  PELVIC: right labia swollen with 1 herpetic lesion seen on right labia. Right lymph node swollen and tender to touch. No vaginal discharge noted on pelvic examination. No lesions noted on left labia or in vagina.     Assessment and Plan:  1. Screening for STD (sexually transmitted disease) - Hepatitis B Surface AntiGEN - Hepatitis C Antibody - HSV(herpes simplex vrs) 1+2 ab-IgG  2. Possible exposure to STD - Cervicovaginal ancillary only  3. 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 Take for ten days.  Dispense: 20 tablet; Refill: 3 - Herpes simplex virus culture  5. Chronic hypertension - Patient reports being on Norvasc 5mg  in 2016 but stopped medication and has not been seen at a PCP due to not having one.  - amLODipine (NORVASC) 5 MG tablet; Take 1 tablet (5 mg total) by mouth daily.  Dispense: 30 tablet; Refill: 3 - Ambulatory referral to Internal Medicine  Discussed with patient s/s of elevated BP and need to start medication today as prescribed. Educated on symptoms that patient needs to be seen for at [facility] immediately- patient verbalizes understanding  Will call with results and manage accordingly.    Lajean Manes, CNM 03/30/18

## 2018-03-31 LAB — HEPATITIS C ANTIBODY: Hep C Virus Ab: <0.1 s/co ratio (ref 0.0–0.9)

## 2018-03-31 LAB — HSV(HERPES SIMPLEX VRS) I + II AB-IGG
HSV 1 Glycoprotein G Ab, IgG: <0.91 index (ref 0.00–0.90)
HSV 2 IgG, Type Spec: 6 index — ABNORMAL HIGH (ref 0.00–0.90)

## 2018-03-31 LAB — HEPATITIS B SURFACE ANTIGEN: Hepatitis B Surface Ag: NEGATIVE

## 2018-04-01 LAB — CERVICOVAGINAL ANCILLARY ONLY
Bacterial vaginitis: POSITIVE — AB
Candida vaginitis: NEGATIVE
Chlamydia: NEGATIVE
Neisseria Gonorrhea: NEGATIVE
Trichomonas: NEGATIVE

## 2018-04-02 LAB — HERPES SIMPLEX VIRUS CULTURE

## 2018-05-01 ENCOUNTER — Emergency Department (HOSPITAL_COMMUNITY)
Admission: EM | Admit: 2018-05-01 | Discharge: 2018-05-03 | Disposition: A | Discharge status: Other Acute Inpatient Hospital | Payer: Medicare Other | Attending: Emergency Medicine | Admitting: Emergency Medicine

## 2018-05-01 ENCOUNTER — Other Ambulatory Visit: Payer: Self-pay

## 2018-05-01 ENCOUNTER — Encounter (HOSPITAL_COMMUNITY): Payer: Self-pay | Admitting: Emergency Medicine

## 2018-05-01 DIAGNOSIS — X838XXA Intentional self-harm by other specified means, initial encounter: Secondary | ICD-10-CM | POA: Insufficient documentation

## 2018-05-01 DIAGNOSIS — T1491XA Suicide attempt, initial encounter: Secondary | ICD-10-CM | POA: Insufficient documentation

## 2018-05-01 DIAGNOSIS — F329 Major depressive disorder, single episode, unspecified: Secondary | ICD-10-CM | POA: Diagnosis not present

## 2018-05-01 DIAGNOSIS — F32A Depression, unspecified: Secondary | ICD-10-CM

## 2018-05-01 DIAGNOSIS — Y998 Other external cause status: Secondary | ICD-10-CM | POA: Insufficient documentation

## 2018-05-01 DIAGNOSIS — T50992A Poisoning by other drugs, medicaments and biological substances, intentional self-harm, initial encounter: Secondary | ICD-10-CM | POA: Diagnosis not present

## 2018-05-01 DIAGNOSIS — I10 Essential (primary) hypertension: Secondary | ICD-10-CM | POA: Insufficient documentation

## 2018-05-01 DIAGNOSIS — Y9389 Activity, other specified: Secondary | ICD-10-CM | POA: Insufficient documentation

## 2018-05-01 DIAGNOSIS — Y929 Unspecified place or not applicable: Secondary | ICD-10-CM | POA: Insufficient documentation

## 2018-05-01 DIAGNOSIS — R45851 Suicidal ideations: Secondary | ICD-10-CM

## 2018-05-01 DIAGNOSIS — E119 Type 2 diabetes mellitus without complications: Secondary | ICD-10-CM | POA: Insufficient documentation

## 2018-05-01 DIAGNOSIS — F1420 Cocaine dependence, uncomplicated: Secondary | ICD-10-CM | POA: Insufficient documentation

## 2018-05-01 DIAGNOSIS — Z79899 Other long term (current) drug therapy: Secondary | ICD-10-CM | POA: Insufficient documentation

## 2018-05-01 DIAGNOSIS — F17210 Nicotine dependence, cigarettes, uncomplicated: Secondary | ICD-10-CM | POA: Insufficient documentation

## 2018-05-01 DIAGNOSIS — F315 Bipolar disorder, current episode depressed, severe, with psychotic features: Secondary | ICD-10-CM | POA: Insufficient documentation

## 2018-05-01 LAB — CBC
HEMATOCRIT: 40.2 % (ref 36.0–46.0)
Hemoglobin: 12.3 g/dL (ref 12.0–15.0)
MCH: 23.6 pg — ABNORMAL LOW (ref 26.0–34.0)
MCHC: 30.6 g/dL (ref 30.0–36.0)
MCV: 77 fL — ABNORMAL LOW (ref 78.0–100.0)
PLATELETS: 398 10*3/uL (ref 150–400)
RBC: 5.22 MIL/uL — ABNORMAL HIGH (ref 3.87–5.11)
RDW: 15.9 % — AB (ref 11.5–15.5)
WBC: 8.2 10*3/uL (ref 4.0–10.5)

## 2018-05-01 LAB — COMPREHENSIVE METABOLIC PANEL
ALK PHOS: 79 U/L (ref 38–126)
ALT: 21 U/L (ref 0–44)
AST: 22 U/L (ref 15–41)
Albumin: 3.8 g/dL (ref 3.5–5.0)
Anion gap: 10 (ref 5–15)
BUN: 6 mg/dL (ref 6–20)
CO2: 25 mmol/L (ref 22–32)
Calcium: 8.8 mg/dL — ABNORMAL LOW (ref 8.9–10.3)
Chloride: 105 mmol/L (ref 98–111)
Creatinine, Ser: 1.09 mg/dL — ABNORMAL HIGH (ref 0.44–1.00)
Glucose, Bld: 130 mg/dL — ABNORMAL HIGH (ref 70–99)
POTASSIUM: 3.5 mmol/L (ref 3.5–5.1)
Sodium: 140 mmol/L (ref 135–145)
TOTAL PROTEIN: 7.3 g/dL (ref 6.5–8.1)
Total Bilirubin: 0.7 mg/dL (ref 0.3–1.2)

## 2018-05-01 LAB — ACETAMINOPHEN LEVEL

## 2018-05-01 LAB — SALICYLATE LEVEL

## 2018-05-01 LAB — ETHANOL: Alcohol, Ethyl (B): <10 mg/dL (ref <10)

## 2018-05-01 LAB — I-STAT BETA HCG BLOOD, ED (MC, WL, AP ONLY)

## 2018-05-01 NOTE — ED Notes (Signed)
Pt states she is ready to leave.  Encouraged pt to stay and she states she no longer wants to wait.  [place] sitter at bedside.  Nehemiah Settle, [place] notified of pt and came to triage to assess pt.

## 2018-05-01 NOTE — ED Notes (Signed)
Personal items placed in locker 3

## 2018-05-01 NOTE — ED Provider Notes (Signed)
[facility] EMERGENCY DEPARTMENT Provider Note   CSN: [id number] Arrival date & time: 05/01/18  2147     History   Chief Complaint Chief Complaint  Patient presents with  . Suicidal    HPI Felicia Foster is a 32 y.o. female.  Patient to ED via [employer], called by patient because she wanted to hurt herself. She has a plan to overdose on pills. She reports having taken overdoses of "pills" 2 days ago, and once before that. She has never sought help with SI. No HI/AVH. She denies substance abuse issues.   The history is provided by the patient. No language interpreter was used.    Past Medical History:  Diagnosis Date  . Diabetes mellitus without complication ([place])   . Fibroids   . Gestational diabetes   . Hypertension   . Mental disorder    bipolar    Patient Active Problem List   Diagnosis Date Noted  . Status post cesarean section 08/08/2014  . Previous preterm delivery x 3 (33, 36 and 35 weeks), antepartum 06/18/2014  . Previous cesarean delivery x 4, antepartum 06/18/2014  . History of bipolar disorder 06/12/2014  . History of gestational diabetes 06/12/2014  . History of congenital or genetic condition 06/12/2014  . Limited prenatal care in third trimester   . 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 Gestational diabetes mellitus, antepartum     Past Surgical History:  Procedure Laterality Date  . CESAREAN SECTION     x 4  . CESAREAN SECTION N/A 08/08/2014   Procedure: CESAREAN SECTION;  Surgeon: Truett Mainland, DO;  Location: [facility];  Service: Obstetrics;  Laterality: N/A;  repeat  . DILATION AND CURETTAGE OF UTERUS       OB History    Gravida  8   Para  5   Term  2   Preterm  3   AB  3   Living  5     SAB  1   TAB  2   Ectopic  0   Multiple  0   Live Births  5            Home Medications    Prior to Admission medications   Medication Sig Start Date End Date Taking? Authorizing  Provider  amLODipine (NORVASC) 5 MG tablet Take 1 tablet (5 mg total) by mouth daily. 03/30/18   Lajean Manes, CNM  fluconazole (DIFLUCAN) 150 MG tablet Take 1 tablet (150 mg total) by mouth daily. Patient not taking: Reported on 03/30/2018 04/23/17   Emily Filbert, MD  HYDROcodone-acetaminophen (NORCO/VICODIN) 5-325 MG tablet Take 1-2 tablets by mouth every 4 (four) hours as needed for moderate pain. Patient not taking: Reported on 03/30/2018 06/25/15   Orpah Greek, MD  metroNIDAZOLE (FLAGYL) 500 MG tablet Take 1 tablet (500 mg total) by mouth 2 (two) times daily. Patient not taking: Reported on 03/30/2018 04/28/17   Emily Filbert, MD  valACYclovir (VALTREX) 1000 MG tablet Take 1 tablet (1,000 mg total) by mouth 2 (two) times daily. Take for ten days. 03/30/18   Lajean Manes, CNM    Family History Family History  Problem Relation Age of Onset  . Cancer Mother     Social History Social History   Tobacco Use  . Smoking status: Current Some Day Smoker    Packs/day: 0.25    Types: Cigarettes  . Smokeless tobacco: Never Used  Substance  Use Topics  . Alcohol use: Yes    Alcohol/week: 0.0 standard drinks    Comment: social  . Drug use: No     Allergies   Patient has no known allergies.   Review of Systems Review of Systems  Constitutional: Negative for chills and fever.  HENT: Negative.   Respiratory: Negative.   Cardiovascular: Negative.   Gastrointestinal: Negative.   Musculoskeletal: Negative.   Skin: Negative.   Neurological: Negative.   Psychiatric/Behavioral: Positive for dysphoric mood, self-injury and suicidal ideas.     Physical Exam Updated Vital Signs BP (!) 156/97 (BP Location: Right Arm)   Pulse 67   Temp 99.3 F (37.4 C) (Oral)   Resp 17   LMP 04/12/2018   SpO2 91%   Physical Exam  Constitutional: She appears well-developed and well-nourished.  HENT:  Head: Normocephalic.  Neck: Normal range of motion. Neck supple.    Cardiovascular: Normal rate and regular rhythm.  Pulmonary/Chest: Effort normal and breath sounds normal. She has no wheezes. She has no rales.  Abdominal: Soft. Bowel sounds are normal. There is no tenderness. There is no rebound and no guarding.  Musculoskeletal: Normal range of motion.  Neurological: She is alert. No cranial nerve deficit.  Skin: Skin is warm and dry. No rash noted.  Psychiatric: Her affect is angry. She is not actively hallucinating. She exhibits a depressed mood. She expresses suicidal ideation. She expresses suicidal plans.     ED Treatments / Results  Labs (all labs ordered are listed, but only abnormal results are displayed) Labs Reviewed  COMPREHENSIVE METABOLIC PANEL - Abnormal; Notable for the following components:      Result Value   Glucose, Bld 130 (*)    Creatinine, Ser 1.09 (*)    Calcium 8.8 (*)    All other components within normal limits  ACETAMINOPHEN LEVEL - Abnormal; Notable for the following components:   Acetaminophen (Tylenol), Serum <10 (*)    All other components within normal limits  CBC - Abnormal; Notable for the following components:   RBC 5.22 (*)    MCV 77.0 (*)    MCH 23.6 (*)    RDW 15.9 (*)    All other components within normal limits  ETHANOL  SALICYLATE LEVEL  RAPID URINE DRUG SCREEN, HOSP PERFORMED  I-STAT BETA HCG BLOOD, ED ([facility], [facility], [facility] ONLY)    EKG None  Radiology No results found.  Procedures Procedures (including critical care time)  Medications Ordered in ED Medications - No data to display   Initial Impression / Assessment and Plan / ED Course  I have reviewed the triage vital signs and the nursing notes.  Pertinent labs & imaging results that were available during my care of the patient were reviewed by me and considered in my medical decision making (see chart for details).     Patient presents with depression with SI. She called [employer] tonight for help and reports wanting to take an overdose of  pills. She also states she took overdoses of "pills" 2 days ago and once before that.   She is becoming frustrated at being in a hallway chair. She is wanting to leave, however, continues to admit to [place]. She is tearful, withdrawn and admits to 2 recent overdose attempts. Discussed that she will need for further assessment with TTS/[facility] and that I would not be able to allow her to leave out of concern for her safety. She nods understanding. She remains voluntary now, but will IVC if she attempts  to leave. Nurse made aware.   TTS consultation complete and she is found to meet inpatient criteria for suicidal ideation and self harm. There are no beds available at [facility] - placement will be sought.  Final Clinical Impressions(s) / ED Diagnoses   Final diagnoses:  None   1. SI 2. Depression 3. Recent overdose  ED Discharge Orders    None       Charlann Lange, PA-C 05/02/18 0515    Ward, Delice Bison, DO 05/02/18 [phone]

## 2018-05-01 NOTE — ED Notes (Signed)
Pt changed into burgundy scrubs, security at triage to wand pt, and staffing notified of [facility] sitter need.  Dar[place], EMT placed belongings in locker.

## 2018-05-01 NOTE — ED Triage Notes (Signed)
Pt to triage via [employer].  States she is depressed and suicidal with a plan to overdose on pills.  Asked pt if she had tried to kill herself before and she states yes.  Then asked when the last time was and she said she took a handful of Benadryl about 30 min ago.  Questioned pt again about it and she states she didn't take pills today and that was 3 months ago.  Pt tearful.

## 2018-05-02 LAB — RAPID URINE DRUG SCREEN, HOSP PERFORMED
Amphetamines: NOT DETECTED
BARBITURATES: NOT DETECTED
BENZODIAZEPINES: NOT DETECTED
COCAINE: POSITIVE — AB
Opiates: NOT DETECTED
Tetrahydrocannabinol: POSITIVE — AB

## 2018-05-02 LAB — CBG MONITORING, ED: GLUCOSE-CAPILLARY: 94 mg/dL (ref 70–99)

## 2018-05-02 MED ORDER — ACETAMINOPHEN 325 MG PO TABS
650.0000 mg | ORAL_TABLET | Freq: Four times a day (QID) | ORAL | Status: DC | PRN
  Administered 2018-05-02: 650 mg via ORAL
  Filled 2018-05-02: qty 2

## 2018-05-02 MED ORDER — IBUPROFEN 400 MG PO TABS: 600.0000 mg | ORAL_TABLET | Freq: Once | ORAL | Status: DC

## 2018-05-02 MED ORDER — AMLODIPINE BESYLATE 5 MG PO TABS
5.0000 mg | ORAL_TABLET | Freq: Once | ORAL | Status: AC
  Administered 2018-05-02: 5 mg via ORAL
  Filled 2018-05-02: qty 1

## 2018-05-02 MED ORDER — LORAZEPAM 1 MG PO TABS
1.0000 mg | ORAL_TABLET | Freq: Once | ORAL | Status: AC
  Administered 2018-05-02: 1 mg via ORAL
  Filled 2018-05-02: qty 1

## 2018-05-02 NOTE — ED Notes (Signed)
Patient at desk calling her daugter-Monique,RN

## 2018-05-02 NOTE — Progress Notes (Signed)
Pt is accepted to [facility]; room 301-1 Patriciaann Clan, PA is the accepting provider.   Dr. Parke Poisson is the attending provider.   Call report to [phone]   [place] has notified appropriate [facility] ED staff Pt is voluntary and can be transported by [facility].  Pt is scheduled to arrive at [facility] at 9:30pm   Audree Camel, [place], [place] Disposition [place] [facility]/TTS [phone]
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
## 2018-05-02 NOTE — BH Assessment (Signed)
[place] Assessment Progress Note  Patient referred to the following facilities for consideration:  [facility]

## 2018-05-02 NOTE — ED Notes (Signed)
Patient's BP elevated prior to transfer; Dr. Maryan Rued to placed BP medication; Recheck BP in an hour for ok to transfer to [facility]; Tori at [facility]  Notified of BP elevation and RN will call back once BP has decreased-Monique,RN

## 2018-05-02 NOTE — ED Notes (Signed)
Pt has signed voluntary consent from and it has been faxed over to [facility]

## 2018-05-02 NOTE — ED Notes (Signed)
Pt resting quietly with sitter at door.

## 2018-05-02 NOTE — ED Notes (Signed)
Pt state discomfort improves with hot shower.Declines motrin

## 2018-05-02 NOTE — ED Notes (Signed)
REPORT TO CRYSTAL RN 

## 2018-05-02 NOTE — ED Notes (Signed)
Pt got breakfast made phone call and then cried

## 2018-05-02 NOTE — ED Notes (Signed)
Pt reqwuesting fruit . Same called for from dietary.

## 2018-05-02 NOTE — ED Notes (Signed)
Pt oob to bathroom for shower.

## 2018-05-02 NOTE — ED Notes (Addendum)
Pt now stats sxhe feels sore all over and c/o pain at head and ears. Some sniffling noted and endorsees nasal drainage. Will inform provider.

## 2018-05-03 ENCOUNTER — Inpatient Hospital Stay (HOSPITAL_COMMUNITY)
Admission: AD | Admit: 2018-05-03 | Discharge: 2018-05-06 | DRG: 885 | Disposition: A | Discharge status: Home / Self Care | Payer: Medicare Other | Source: Intra-hospital | Attending: Psychiatry | Admitting: Psychiatry

## 2018-05-03 ENCOUNTER — Encounter (HOSPITAL_COMMUNITY): Payer: Self-pay | Admitting: *Deleted

## 2018-05-03 ENCOUNTER — Other Ambulatory Visit: Payer: Self-pay

## 2018-05-03 DIAGNOSIS — R0981 Nasal congestion: Secondary | ICD-10-CM | POA: Diagnosis present

## 2018-05-03 DIAGNOSIS — Z8632 Personal history of gestational diabetes: Secondary | ICD-10-CM | POA: Diagnosis not present

## 2018-05-03 DIAGNOSIS — F315 Bipolar disorder, current episode depressed, severe, with psychotic features: Principal | ICD-10-CM | POA: Diagnosis present

## 2018-05-03 DIAGNOSIS — E119 Type 2 diabetes mellitus without complications: Secondary | ICD-10-CM | POA: Diagnosis present

## 2018-05-03 DIAGNOSIS — F410 Panic disorder [episodic paroxysmal anxiety] without agoraphobia: Secondary | ICD-10-CM | POA: Diagnosis present

## 2018-05-03 DIAGNOSIS — I10 Essential (primary) hypertension: Secondary | ICD-10-CM | POA: Diagnosis present

## 2018-05-03 DIAGNOSIS — Z809 Family history of malignant neoplasm, unspecified: Secondary | ICD-10-CM | POA: Diagnosis not present

## 2018-05-03 DIAGNOSIS — R45851 Suicidal ideations: Secondary | ICD-10-CM | POA: Diagnosis present

## 2018-05-03 DIAGNOSIS — F319 Bipolar disorder, unspecified: Secondary | ICD-10-CM

## 2018-05-03 DIAGNOSIS — D259 Leiomyoma of uterus, unspecified: Secondary | ICD-10-CM | POA: Diagnosis present

## 2018-05-03 DIAGNOSIS — F419 Anxiety disorder, unspecified: Secondary | ICD-10-CM | POA: Diagnosis not present

## 2018-05-03 DIAGNOSIS — F17210 Nicotine dependence, cigarettes, uncomplicated: Secondary | ICD-10-CM | POA: Diagnosis not present

## 2018-05-03 DIAGNOSIS — J309 Allergic rhinitis, unspecified: Secondary | ICD-10-CM | POA: Diagnosis present

## 2018-05-03 DIAGNOSIS — G4700 Insomnia, unspecified: Secondary | ICD-10-CM | POA: Diagnosis not present

## 2018-05-03 MED ORDER — TRAZODONE HCL 50 MG PO TABS
50.0000 mg | ORAL_TABLET | Freq: Every evening | ORAL | Status: DC | PRN
  Administered 2018-05-03 – 2018-05-04 (×3): 50 mg via ORAL
  Filled 2018-05-03 (×3): qty 1

## 2018-05-03 MED ORDER — SERTRALINE HCL 25 MG PO TABS
25.0000 mg | ORAL_TABLET | Freq: Every day | ORAL | Status: DC
  Administered 2018-05-03 – 2018-05-05 (×3): 25 mg via ORAL
  Filled 2018-05-03 (×5): qty 1

## 2018-05-03 MED ORDER — ARIPIPRAZOLE 5 MG PO TABS
5.0000 mg | ORAL_TABLET | Freq: Every day | ORAL | Status: DC
  Administered 2018-05-03 – 2018-05-05 (×3): 5 mg via ORAL
  Filled 2018-05-03 (×5): qty 1

## 2018-05-03 MED ORDER — AMLODIPINE BESYLATE 10 MG PO TABS
10.0000 mg | ORAL_TABLET | Freq: Every day | ORAL | Status: DC
  Filled 2018-05-03: qty 1

## 2018-05-03 MED ORDER — ALUM & MAG HYDROXIDE-SIMETH 200-200-20 MG/5ML PO SUSP: 30.0000 mL | ORAL | Status: DC | PRN

## 2018-05-03 MED ORDER — MAGNESIUM HYDROXIDE 400 MG/5ML PO SUSP: 30.0000 mL | Freq: Every day | ORAL | Status: DC | PRN

## 2018-05-03 MED ORDER — ACETAMINOPHEN 325 MG PO TABS
650.0000 mg | ORAL_TABLET | Freq: Four times a day (QID) | ORAL | Status: DC | PRN
  Administered 2018-05-03 (×2): 650 mg via ORAL
  Filled 2018-05-03 (×2): qty 2

## 2018-05-03 MED ORDER — CLONIDINE HCL 0.1 MG PO TABS
0.1000 mg | ORAL_TABLET | Freq: Once | ORAL | Status: AC
  Administered 2018-05-03: 0.1 mg via ORAL
  Filled 2018-05-03 (×2): qty 1

## 2018-05-03 MED ORDER — AMLODIPINE BESYLATE 10 MG PO TABS
10.0000 mg | ORAL_TABLET | Freq: Every day | ORAL | Status: DC
  Administered 2018-05-03 – 2018-05-06 (×4): 10 mg via ORAL
  Filled 2018-05-03: qty 1
  Filled 2018-05-03: qty 7
  Filled 2018-05-03 (×3): qty 1
  Filled 2018-05-03: qty 2
  Filled 2018-05-03: qty 1
  Filled 2018-05-03: qty 2

## 2018-05-03 MED ORDER — AMLODIPINE BESYLATE 5 MG PO TABS
5.0000 mg | ORAL_TABLET | Freq: Every day | ORAL | Status: DC
  Filled 2018-05-03 (×3): qty 1

## 2018-05-03 MED ORDER — HYDROXYZINE HCL 25 MG PO TABS
25.0000 mg | ORAL_TABLET | Freq: Four times a day (QID) | ORAL | Status: DC | PRN
  Administered 2018-05-03 – 2018-05-04 (×3): 25 mg via ORAL
  Filled 2018-05-03: qty 1
  Filled 2018-05-03: qty 4
  Filled 2018-05-03: qty 1

## 2018-05-03 MED ORDER — NICOTINE 21 MG/24HR TD PT24
21.0000 mg | MEDICATED_PATCH | Freq: Every day | TRANSDERMAL | Status: DC
  Filled 2018-05-03 (×6): qty 1

## 2018-05-03 MED ORDER — LISINOPRIL 10 MG PO TABS
10.0000 mg | ORAL_TABLET | Freq: Every day | ORAL | Status: DC
  Administered 2018-05-03 – 2018-05-06 (×4): 10 mg via ORAL
  Filled 2018-05-03 (×3): qty 1
  Filled 2018-05-03: qty 7
  Filled 2018-05-03: qty 1
  Filled 2018-05-03: qty 2
  Filled 2018-05-03: qty 1
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
## 2018-05-03 NOTE — ED Notes (Signed)
Patient is cleared to transfer to [facility]; [facility] transport called-Monique,RN
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
## 2018-05-03 NOTE — Plan of Care (Signed)
Progress Note  D: pt found in bed; pt noncompliant with medication administration. Pt states she slept poorly. Pt rates her depression/hopelessness/anxiety all 10/10. Pt denies any physical symptoms besides pain, which she rates at a 7 in her head and back. Pt failed to have a goal for today. Pt denies any si/hi/ah/vh and verbally agrees to approach staff if these become apparent.  A: pt provided support and encouragement. Pt given medications per protocol and standing orders. Q12m safety checks implemented and continued.  R: pt safe on the unit. Will continue to monitor.   Pt progressing in the following metrics  Problem: Education: Goal: Knowledge of [place] [facility] Education information/materials will improve Outcome: Progressing Goal: Emotional status will improve Outcome: Progressing Goal: Verbalization of understanding the information provided will improve Outcome: Progressing   Problem: Activity: Goal: Sleeping patterns will improve Outcome: Progressing   Problem: Coping: Goal: Ability to demonstrate self-control will improve Outcome: Progressing   Problem: Safety: Goal: Periods of time without injury will increase Outcome: Progressing

## 2018-05-03 NOTE — Progress Notes (Signed)
D   Pt isolates to her room but did come to get medication when prompted   She complained of congestion and depression   Her interaction is limited and she hasnt been going to groups A    Verbal support given   Medications administered and effectiveness monitored    Q 15 min checks R    Pt is presently safe

## 2018-05-03 NOTE — Tx Team (Signed)
Initial Treatment Plan 05/03/2018 3:47 AM [facility] ZES:[id number]    PATIENT2 STRESSORS: Financial difficulties Marital or family conflict Medication change or noncompliance Substance abuse   PATIENT STRENGTHS: Average or above average intelligence Capable of independent living General fund of knowledge Motivation for treatment/growth   PATIENT IDENTIFIED PROBLEMS: Depression  Risk for self harm  Self medicating with alcohol, cocaine, and THC    "My kids and boyfriend are stressing me out"  "I don't have a goal for being here"           DISCHARGE CRITERIA:  Ability to meet basic life and health needs Adequate post-discharge living arrangements Improved stabilization in mood, thinking, and/or behavior Motivation to continue treatment in a less acute level of care Verbal commitment to aftercare and medication compliance Withdrawal symptoms are absent or subacute and managed without 24-hour nursing intervention  PRELIMINARY DISCHARGE PLAN: Attend aftercare/continuing care group Outpatient therapy Return to previous living arrangement  PATIENT/FAMILY INVOLVEMENT: This treatment plan has been presented to and reviewed with the patient, Felicia Foster, and/or family member.  The patient and family have been given the opportunity to ask questions and make suggestions.  Ronney Asters, RN 05/03/2018, 3:47 AM

## 2018-05-03 NOTE — B[facility] Counselor (Addendum)
CSW attempted to complete PSA with the patient, however the patient was asleep in her bed. Patient slightly awakened, however she reports she does not want to participate in PSA at this time.   CSW will attempt to complete PSA at a later time.   Radonna Ricker, MSW, [place] Worker [facility]  Phone: [phone]
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
## 2018-05-03 NOTE — Progress Notes (Signed)
Psychoeducational Group Note  Date:  05/03/2018 Time:  2241  Group Topic/Focus:  Wrap-Up Group:   The focus of this group is to help patients review their daily goal of treatment and discuss progress on daily workbooks.  Participation Level: Did Not Attend  Participation Quality:  Not Applicable  Affect:  Not Applicable  Cognitive:  Not Applicable  Insight:  Not Applicable  Engagement in Group: Not Applicable  Additional Comments:  The patient did not attend group since she remained asleep in her bed.   Archie Balboa S 05/03/2018, 10:41 PM

## 2018-05-03 NOTE — Progress Notes (Signed)
Patient ID: Felicia [facility], female   DOB: 11/02/1985, 32 y.o.   MRN: [id number] PER STATE REGULATIONS 482.30  THIS CHART WAS REVIEWED FOR MEDICAL NECESSITY WITH RESPECT TO THE PATIENT'S ADMISSION/DURATION OF STAY.  NEXT REVIEW DATE:05/07/18  Roma Schanz, RN, BSN CASE MANAGER

## 2018-05-03 NOTE — [facility] Group Notes (Signed)
[place] Group Notes:  (Nursing/MHT/Case Management/Adjunct)  Date:  05/03/2018  Time:  4:15 PM  Type of Therapy:  Nurse Education  Participation Level:  Did Not Attend  Baron Sane 05/03/2018, 5:43 PM
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
## 2018-05-03 NOTE — [facility] Group Notes (Signed)
[facility] Mental [facility] Association Group Therapy      05/03/2018 2:39 PM  Type of Therapy: Mental Health Association Presentation  Participation Level: Did Not Attend    Summary of Progress/Problems: Invited, chose not to attend.    [place] Social Worker

## 2018-05-03 NOTE — ED Notes (Signed)
Spoke with [facility] RN to confirm what BP should be for patient to be transferred; pt's BP has decreased since BP med given a hour ago; [facility] RN to call back to confirm if patient is ok to be transferred to [facility]; Patient is a&ox 4 and asymptomatic at this time-Monique,RN

## 2018-05-03 NOTE — Discharge Instructions (Signed)
Transfer to [facility] 

## 2018-05-04 DIAGNOSIS — J309 Allergic rhinitis, unspecified: Secondary | ICD-10-CM | POA: Diagnosis present

## 2018-05-04 DIAGNOSIS — G4700 Insomnia, unspecified: Secondary | ICD-10-CM

## 2018-05-04 LAB — LIPID PANEL
Cholesterol: 138 mg/dL (ref 0–200)
HDL: 38 mg/dL — ABNORMAL LOW (ref >40)
LDL CALC: 69 mg/dL (ref 0–99)
Total CHOL/HDL Ratio: 3.6 RATIO
Triglycerides: 153 mg/dL — ABNORMAL HIGH (ref <150)
VLDL: 31 mg/dL (ref 0–40)

## 2018-05-04 LAB — TSH: TSH: 1.728 u[IU]/mL (ref 0.350–4.500)

## 2018-05-04 MED ORDER — FLUTICASONE PROPIONATE 50 MCG/ACT NA SUSP
2.0000 | Freq: Two times a day (BID) | NASAL | Status: DC
  Administered 2018-05-04 – 2018-05-05 (×2): 2 via NASAL
  Filled 2018-05-04 (×2): qty 16

## 2018-05-04 NOTE — Progress Notes (Signed)
Nursing Progress Note: 7p-7a D: Pt currently presents with a anxious/irritable affect and behavior. Pt states "My family is not being that supportive." Interacting appropriately with the milieu. Pt reports good sleep during the previous night with current medication regimen. Pt did attend wrap-up group.  A: Pt provided with medications per providers orders. Pt's labs and vitals were monitored throughout the night. Pt supported emotionally and encouraged to express concerns and questions. Pt educated on medications.  R: Pt's safety ensured with 15 minute and environmental checks. Pt currently denies SI, HI, and AVH. Pt verbally contracts to seek staff if SI,HI, or AVH occurs and to consult with staff before acting on any harmful thoughts. Will continue to monitor.

## 2018-05-04 NOTE — [facility] Group Notes (Signed)
Adult Psychoeducational Group Note  Date:  05/04/2018 Time:  9:11 PM  Group Topic/Focus:  Wrap-Up Group:   The focus of this group is to help patients review their daily goal of treatment and discuss progress on daily workbooks.  Participation Level:  Did Not Attend  Participation Quality:  Did not attend  Affect:  Did not attend  Cognitive:  Did not attend  Insight: None  Engagement in Group:  Did not attend  Modes of Intervention:  Did not attend  Additional Comments:  Did not attend  Noel Christmas 05/04/2018, 9:11 PM

## 2018-05-04 NOTE — Progress Notes (Signed)
[facility] MD Progress Note  05/04/2018 1:35 PM Felicia Foster  MRN:  [id number]   Subjective: Patient's major complaint today was her congestion.  She states that she does not normally have any allergies and does not take any medications for that.  She states that it is interrupted her sleep and she has been waking up coughing.  She denies any suicidal or homicidal ideations and denies any hallucinations today.  Patient denies any withdrawal symptoms today as well.  She also denies any medication side effects today.  Patient reiterates that she just wants to have her congestion cleared up some.  Objective: Patient's chart and findings reviewed and discussed with treatment team.  Patient presents in the hallway and is cooperative and calm.  Patient has been seen interacting appropriately and with almost every peer unit.  Patient is also been appropriate with staff and has been attending some groups.  She does have a tendency to avoid the providers for a little while.  Concerned that patient may have sought secondary gain from hospital.   Principal Problem: Bipolar 1 disorder, depressed ([place]) Diagnosis:   Patient Active Problem List   Diagnosis Date Noted  . Bipolar 1 disorder, depressed ([place]) [F31.9] 05/03/2018  . Status post cesarean section [Z98.891] 08/08/2014  . Previous preterm delivery x 3 (33, 36 and 35 weeks), antepartum [O09.219] 06/18/2014  . Previous cesarean delivery x 4, antepartum [O34.219] 06/18/2014  . History of bipolar disorder [Z86.59] 06/12/2014  . History of gestational diabetes [Z86.32] 06/12/2014  . History of congenital or genetic condition [Z87.798] 06/12/2014  . Limited prenatal care in third trimester [O09.33]   . Previous pregnancy with fetus that had Trisomy 9 mosaic - terminated [O35.2XX0]   . Gestational diabetes mellitus, antepartum [O24.419]    Total Time spent with patient: 30 minutes  Past Psychiatric History: See H&P  Past Medical History:  Past Medical  History:  Diagnosis Date  . Diabetes mellitus without complication ([place])   . Fibroids   . Gestational diabetes   . Hypertension   . Mental disorder    bipolar    Past Surgical History:  Procedure Laterality Date  . CESAREAN SECTION     x 4  . CESAREAN SECTION N/A 08/08/2014   Procedure: CESAREAN SECTION;  Surgeon: Truett Mainland, DO;  Location: [facility];  Service: Obstetrics;  Laterality: N/A;  repeat  . DILATION AND CURETTAGE OF UTERUS     Family History:  Family History  Problem Relation Age of Onset  . Cancer Mother    Family Psychiatric  History: See H&P Social History:  Social History   Substance and Sexual Activity  Alcohol Use Yes  . Alcohol/week: 10.0 - 12.0 standard drinks  . Types: 10 - 12 Cans of beer per week   Comment: Pt admits drinking daily     Social History   Substance and Sexual Activity  Drug Use Yes  . Types: Cocaine    Social History   Socioeconomic History  . Marital status: Single    Spouse name: Not on file  . Number of children: Not on file  . Years of education: Not on file  . Highest education level: Not on file  Occupational History  . Not on file  Social Needs  . Financial resource strain: Not on file  . Food insecurity:    Worry: Not on file    Inability: Not on file  . Transportation needs:    Medical: Not on file  Non-medical: Not on file  Tobacco Use  . Smoking status: Current Some Day Smoker    Packs/day: 0.50    Types: Cigarettes  . Smokeless tobacco: Never Used  . Tobacco comment: Pt declined  Substance and Sexual Activity  . Alcohol use: Yes    Alcohol/week: 10.0 - 12.0 standard drinks    Types: 10 - 12 Cans of beer per week    Comment: Pt admits drinking daily  . Drug use: Yes    Types: Cocaine  . Sexual activity: Yes    Birth control/protection: None  Lifestyle  . Physical activity:    Days per week: Not on file    Minutes per session: Not on file  . Stress: Not on file  Relationships  . Social  connections:    Talks on phone: Not on file    Gets together: Not on file    Attends religious service: Not on file    Active member of club or organization: Not on file    Attends meetings of clubs or organizations: Not on file    Relationship status: Not on file  Other Topics Concern  . Not on file  Social History Narrative   ** Merged History Encounter **       Additional Social History:    Pain Medications: See home med list Prescriptions: See home med list Over the Counter: See home med list History of alcohol / drug use?: Yes Longest period of sobriety (when/how long): unknown Negative Consequences of Use: Financial, Personal relationships Name of Substance 1: cocaine 1 - Age of First Use: 32 1 - Amount (size/oz): varies 1 - Frequency: maybe once a week 1 - Duration: ongoing 1 - Last Use / Amount: 04/30/18 Name of Substance 2: alcohol 2 - Age of First Use: teens 2 - Amount (size/oz): 10-12 cans 2 - Frequency: 4 or more times a week 2 - Duration: ongoing 2 - Last Use / Amount: this week                Sleep: Fair  Appetite:  Good  Current Medications: Current Facility-Administered Medications  Medication Dose Route Frequency Provider Last Rate Last Dose  . acetaminophen (TYLENOL) tablet 650 mg  650 mg Oral Q6H PRN Elmarie Shiley A, NP   650 mg at 05/03/18 1555  . alum & mag hydroxide-simeth (MAALOX/MYLANTA) 200-200-20 MG/5ML suspension 30 mL  30 mL Oral Q4H PRN Elmarie Shiley A, NP      . amLODipine (NORVASC) tablet 10 mg  10 mg Oral Daily Cobos, Myer Peer, MD   10 mg at 05/04/18 0806  . ARIPiprazole (ABILIFY) tablet 5 mg  5 mg Oral Daily Cobos, Myer Peer, MD   5 mg at 05/04/18 0807  . fluticasone (FLONASE) 50 MCG/ACT nasal spray 2 spray  2 spray Each Nare BID Saleha Kalp, Lowry Ram, FNP      . hydrOXYzine (ATARAX/VISTARIL) tablet 25 mg  25 mg Oral Q6H PRN Niel Hummer, NP   25 mg at 05/03/18 2157  . lisinopril (PRINIVIL,ZESTRIL) tablet 10 mg  10 mg Oral Daily  Cobos, Myer Peer, MD   10 mg at 05/04/18 0808  . magnesium hydroxide (MILK OF MAGNESIA) suspension 30 mL  30 mL Oral Daily PRN Elmarie Shiley A, NP      . nicotine (NICODERM CQ - dosed in mg/24 hours) patch 21 mg  21 mg Transdermal Daily Rozetta Nunnery, NP   Stopped at 05/03/18 0900  . sertraline (ZOLOFT) tablet 25  mg  25 mg Oral Daily Cobos, Myer Peer, MD   25 mg at 05/04/18 0808  . traZODone (DESYREL) tablet 50 mg  50 mg Oral QHS PRN Niel Hummer, NP   50 mg at 05/03/18 2157    Lab Results:  Results for orders placed or performed during the hospital encounter of 05/03/18 (from the past 48 hour(s))  TSH     Status: None   Collection Time: 05/04/18  6:46 AM  Result Value Ref Range   TSH 1.728 0.350 - 4.500 uIU/mL    Comment: Performed by a 3rd Generation assay with a functional sensitivity of <=0.01 uIU/mL. Performed at [facility], [place] [address] [place], [place] [address]   Lipid panel     Status: Abnormal   Collection Time: 05/04/18  6:46 AM  Result Value Ref Range   Cholesterol 138 0 - 200 mg/dL   Triglycerides 153 (H) <150 mg/dL   HDL 38 (L) >40 mg/dL   Total CHOL/HDL Ratio 3.6 RATIO   VLDL 31 0 - 40 mg/dL   LDL Cholesterol 69 0 - 99 mg/dL    Comment:        Total Cholesterol/HDL:CHD Risk Coronary Heart Disease Risk Table                     Men   Women  1/2 Average Risk   3.4   3.3  Average Risk       5.0   4.4  2 X Average Risk   9.6   7.1  3 X Average Risk  23.4   11.0        Use the calculated Patient Ratio above and the CHD Risk Table to determine the patient's CHD Risk.        ATP III CLASSIFICATION (LDL):  <100     mg/dL   Optimal  100-129  mg/dL   Near or Above                    Optimal  130-159  mg/dL   Borderline  160-189  mg/dL   High  >190     mg/dL   Very High Performed at [facility] [address], [place], [place] [address]     Blood Alcohol level:  Lab Results  Component Value Date   ETH <10  09/81/1914    Metabolic Disorder Labs: Lab Results  Component Value Date   HGBA1C 6.0 (H) 06/25/2014   MPG 126 (H) 06/25/2014   No results found for: PROLACTIN Lab Results  Component Value Date   CHOL 138 05/04/2018   TRIG 153 (H) 05/04/2018   HDL 38 (L) 05/04/2018   CHOLHDL 3.6 05/04/2018   VLDL 31 05/04/2018   LDLCALC 69 05/04/2018    Physical Findings: AIMS: Facial and Oral Movements Muscles of Facial Expression: None, normal Lips and Perioral Area: None, normal Jaw: None, normal Tongue: None, normal,Extremity Movements Upper (arms, wrists, hands, fingers): None, normal Lower (legs, knees, ankles, toes): None, normal, Trunk Movements Neck, shoulders, hips: None, normal, Overall Severity Severity of abnormal movements (highest score from questions above): None, normal Incapacitation due to abnormal movements: None, normal Patient's awareness of abnormal movements (rate only patient's report): No Awareness, Dental Status Current problems with teeth and/or dentures?: No Does patient usually wear dentures?: No  CIWA:  CIWA-Ar Total: 8 COWS:  COWS Total Score: 7  Musculoskeletal: Strength & Muscle Tone: within normal limits Gait & Station:  normal Patient leans: N/A  Psychiatric Specialty Exam: Physical Exam  Nursing note and vitals reviewed. Constitutional: She is oriented to person, place, and time. She appears well-developed and well-nourished.  Cardiovascular: Normal rate.  Respiratory: Effort normal.  Musculoskeletal: Normal range of motion.  Neurological: She is alert and oriented to person, place, and time.  Skin: Skin is warm.    Review of Systems  Constitutional: Negative.   HENT: Positive for congestion and sinus pain.   Eyes: Negative.   Respiratory: Negative.   Cardiovascular: Negative.   Gastrointestinal: Negative.   Genitourinary: Negative.   Musculoskeletal: Negative.   Skin: Negative.   Neurological: Negative.   Endo/Heme/Allergies:  Negative.     Blood pressure 127/66, pulse 96, temperature 99.7 F (37.6 C), temperature source Oral, resp. rate 18, height 5\' 7"  (1.702 m), weight 122.9 kg, last menstrual period 04/12/2018, SpO2 100 %.Body mass index is 42.44 kg/m.  General Appearance: Casual  Eye Contact:  Good  Speech:  Clear and Coherent and Normal Rate  Volume:  Normal  Mood:  Euthymic  Affect:  Congruent  Thought Process:  Goal Directed and Descriptions of Associations: Intact  Orientation:  Full (Time, Place, and Person)  Thought Content:  WDL  Suicidal Thoughts:  No  Homicidal Thoughts:  No  Memory:  Immediate;   Good Recent;   Good Remote;   Good  Judgement:  Fair  Insight:  Fair  Psychomotor Activity:  Normal  Concentration:  Concentration: Good and Attention Span: Good  Recall:  Good  Fund of Knowledge:  Good  Language:  Good  Akathisia:  No  Handed:  Right  AIMS (if indicated):     Assets:  Communication Skills Desire for Improvement Social Support  ADL's:  Intact  Cognition:  WNL  Sleep:  Number of Hours: 4.75   Problems Addressed Bipolar I disorder, depressed Allergic rhinitis  Treatment Plan Summary: Daily contact with patient to assess and evaluate symptoms and progress in treatment, Medication management and Plan is to: Continue Abilify 5 mg p.o. daily for mood stability Continue Vistaril 25 mg p.o. every 6 hours as needed for anxiety Continue Zoloft 25 mg p.o. daily for mood stability Continue trazodone 50 mg p.o. nightly as needed for insomnia Start fluticasone 50 mcg per actuation nasal spray 2 sprays each nare twice daily for allergic rhinitis Encourage group therapy participation  Lewis Shock, FNP 05/04/2018, 1:35 PM

## 2018-05-04 NOTE — [facility] Group Notes (Signed)
[facility] Group Notes:  (Nursing/MHT/Case Management/Adjunct)  Date:  05/04/2018  Time:  4:00 pm  Type of Therapy:  Psychoeducational Skills  Participation Level:  Did not attend  Participation Quality:    Affect:    Cognitive:    Insight:    Engagement in Group:    Modes of Intervention:    Summary of Progress/Problems:   Cammy Copa 05/04/2018, 6:24 PM

## 2018-05-04 NOTE — Plan of Care (Signed)
Problem: Safety: Goal: Periods of time without injury will increase Outcome: Progressing   Problem: Medication: Goal: Compliance with prescribed medication regimen will improve Outcome: Progressing DAR Note: Pt A & O X4. Visible in milieu at intervals during shift. Denies SI, HI, AVH and pain. "I'm ok, I just congested, I didn't sleep last night even with my mouth open, I need help and like soon". Rates her depression , anxiety and hopelessness all 10/10. Attended 30% of groups.  MD made aware of pt's concerns with nasal congestion. New order received for Flonase BID (see EMar). Sheduled medications offered with verbal education and effects monitored. Emotional support offered to pt. Encouraged pt to voice concerns, attend groups and attend to ADLs. Pt updated on changes in medications regiment. Safety checks maintained at Q 15 minutes intervals without self harm gestures or outburst thus far.  Pt remains safe on unit. Denies concerns at this time. POC continues for safety and mood stability.

## 2018-05-04 NOTE — B[facility] Group Notes (Signed)
LCSW Group Therapy Note 05/04/2018 1:02 PM  Type of Therapy/Topic: Group Therapy: Feelings about Diagnosis  Participation Level: Did Not Attend   Description of Group:  This group will allow patients to explore their thoughts and feelings about diagnoses they have received. Patients will be guided to explore their level of understanding and acceptance of these diagnoses. Facilitator will encourage patients to process their thoughts and feelings about the reactions of others to their diagnosis and will guide patients in identifying ways to discuss their diagnosis with significant others in their lives. This group will be process-oriented, with patients participating in exploration of their own experiences, giving and receiving support, and processing challenge from other group members.  Therapeutic Goals: 1. Patient will demonstrate understanding of diagnosis as evidenced by identifying two or more symptoms of the disorder 2. Patient will be able to express two feelings regarding the diagnosis 3. Patient will demonstrate their ability to communicate their needs through discussion and/or role play  Summary of Patient Progress:  Invited, chose not to attend.     Therapeutic Modalities:  Cognitive Behavioral Therapy Brief Therapy Feelings Identification    [place] Clinical Social Worker

## 2018-05-04 NOTE — Therapy (Signed)
Occupational Therapy Group Note  Date:  05/04/2018 Time:  3:08 PM  Group Topic/Focus:  Stress Management  Participation Level:  Active  Participation Quality:  Appropriate  Affect:  Blunted  Cognitive:  Appropriate  Insight: Limited  Engagement in Group:  Engaged  Modes of Intervention:  Activity, Discussion, Education and Socialization  Additional Comments:    S: "I have been way to co-dependent on people"  O: Education given on stress management as it applies to reintegrating into community. Stress symptom checklist completed, pt encouraged to share area of most noted symptoms. Stress management tools worksheet completed to highlight negative vs positive coping mechanisms. Gratitude journaling h/o given at end of session.  A: Pt presents to group with blunted affect, engaged and often times dominant if not redirected. Pt sharing many instances of her mother whom had passed with other group member whom just experienced a loss. Pt shares she has very high stress according to checklist, symptoms in all areas. Pt in understanding of stress management tools worksheet, sharing she would like to try relaxation and scheduling "me time" in each day. Accepted h/o at end of session.  Zenovia Jarred, MSOT, OTR/L Behavioral Health OT/ Acute Relief OT  Zenovia Jarred 05/04/2018, 3:08 PM

## 2018-05-04 NOTE — Tx Team (Signed)
Interdisciplinary Treatment and Diagnostic Plan Update  05/04/2018 Time of Session:  Felicia Foster MRN: [id number]  Principal Diagnosis: Bipolar 1 disorder, depressed ([place])  Secondary Diagnoses: Principal Problem:   Bipolar 1 disorder, depressed ([place])   Current Medications:  Current Facility-Administered Medications  Medication Dose Route Frequency Provider Last Rate Last Dose  . acetaminophen (TYLENOL) tablet 650 mg  650 mg Oral Q6H PRN Elmarie Shiley A, NP   650 mg at 05/03/18 1555  . alum & mag hydroxide-simeth (MAALOX/MYLANTA) 200-200-20 MG/5ML suspension 30 mL  30 mL Oral Q4H PRN Elmarie Shiley A, NP      . amLODipine (NORVASC) tablet 10 mg  10 mg Oral Daily Cobos, Myer Peer, MD   10 mg at 05/04/18 0806  . ARIPiprazole (ABILIFY) tablet 5 mg  5 mg Oral Daily Cobos, Myer Peer, MD   5 mg at 05/04/18 0807  . hydrOXYzine (ATARAX/VISTARIL) tablet 25 mg  25 mg Oral Q6H PRN Niel Hummer, NP   25 mg at 05/03/18 2157  . lisinopril (PRINIVIL,ZESTRIL) tablet 10 mg  10 mg Oral Daily Cobos, Myer Peer, MD   10 mg at 05/04/18 0808  . magnesium hydroxide (MILK OF MAGNESIA) suspension 30 mL  30 mL Oral Daily PRN Elmarie Shiley A, NP      . nicotine (NICODERM CQ - dosed in mg/24 hours) patch 21 mg  21 mg Transdermal Daily Rozetta Nunnery, NP   Stopped at 05/03/18 0900  . sertraline (ZOLOFT) tablet 25 mg  25 mg Oral Daily Cobos, Myer Peer, MD   25 mg at 05/04/18 0808  . traZODone (DESYREL) tablet 50 mg  50 mg Oral QHS PRN Niel Hummer, NP   50 mg at 05/03/18 2157   PTA Medications: Medications Prior to Admission  Medication Sig Dispense Refill Last Dose  . amLODipine (NORVASC) 5 MG tablet Take 1 tablet (5 mg total) by mouth daily. (Patient not taking: Reported on 05/02/2018) 30 tablet 3 Unknown at Unknown time  . fluconazole (DIFLUCAN) 150 MG tablet Take 1 tablet (150 mg total) by mouth daily. (Patient not taking: Reported on 03/30/2018) 1 tablet 0 Completed Course at Unknown time  .  HYDROcodone-acetaminophen (NORCO/VICODIN) 5-325 MG tablet Take 1-2 tablets by mouth every 4 (four) hours as needed for moderate pain. (Patient not taking: Reported on 05/02/2018) 15 tablet 0 Unknown at Unknown time  . metroNIDAZOLE (FLAGYL) 500 MG tablet Take 1 tablet (500 mg total) by mouth 2 (two) times daily. (Patient not taking: Reported on 03/30/2018) 14 tablet 0 Unknown at Unknown time  . valACYclovir (VALTREX) 1000 MG tablet Take 1 tablet (1,000 mg total) by mouth 2 (two) times daily. Take for ten days. (Patient not taking: Reported on 05/02/2018) 20 tablet 3 Unknown at Unknown time    Patient Stressors: Financial difficulties Marital or family conflict Medication change or noncompliance Substance abuse  Patient Strengths: Average or above average intelligence Capable of independent living General fund of knowledge Motivation for treatment/growth  Treatment Modalities: Medication Management, Group therapy, Case management,  1 to 1 session with clinician, Psychoeducation, Recreational therapy.   Physician Treatment Plan for Primary Diagnosis: Bipolar 1 disorder, depressed ([place]) Long Term Goal(s): Improvement in symptoms so as ready for discharge Improvement in symptoms so as ready for discharge   Short Term Goals: Ability to identify changes in lifestyle to reduce recurrence of condition will improve Ability to disclose and discuss suicidal ideas Ability to demonstrate self-control will improve Compliance with prescribed medications will improve Ability to  identify triggers associated with substance abuse/mental health issues will improve Ability to identify changes in lifestyle to reduce recurrence of condition will improve Ability to verbalize feelings will improve Ability to disclose and discuss suicidal ideas Ability to demonstrate self-control will improve Ability to maintain clinical measurements within normal limits will improve Compliance with prescribed medications will  improve Ability to identify triggers associated with substance abuse/mental health issues will improve  Medication Management: Evaluate patient's response, side effects, and tolerance of medication regimen.  Therapeutic Interventions: 1 to 1 sessions, Unit Group sessions and Medication administration.  Evaluation of Outcomes: Not Met  Physician Treatment Plan for Secondary Diagnosis: Principal Problem:   Bipolar 1 disorder, depressed ([place])  Long Term Goal(s): Improvement in symptoms so as ready for discharge Improvement in symptoms so as ready for discharge   Short Term Goals: Ability to identify changes in lifestyle to reduce recurrence of condition will improve Ability to disclose and discuss suicidal ideas Ability to demonstrate self-control will improve Compliance with prescribed medications will improve Ability to identify triggers associated with substance abuse/mental health issues will improve Ability to identify changes in lifestyle to reduce recurrence of condition will improve Ability to verbalize feelings will improve Ability to disclose and discuss suicidal ideas Ability to demonstrate self-control will improve Ability to maintain clinical measurements within normal limits will improve Compliance with prescribed medications will improve Ability to identify triggers associated with substance abuse/mental health issues will improve     Medication Management: Evaluate patient's response, side effects, and tolerance of medication regimen.  Therapeutic Interventions: 1 to 1 sessions, Unit Group sessions and Medication administration.  Evaluation of Outcomes: Not Met   RN Treatment Plan for Primary Diagnosis: Bipolar 1 disorder, depressed ([place]) Long Term Goal(s): Knowledge of disease and therapeutic regimen to maintain health will improve  Short Term Goals: Ability to participate in decision making will improve, Ability to disclose and discuss suicidal ideas, Ability to  identify and develop effective coping behaviors will improve and Compliance with prescribed medications will improve  Medication Management: RN will administer medications as ordered by provider, will assess and evaluate patient's response and provide education to patient for prescribed medication. RN will report any adverse and/or side effects to prescribing provider.  Therapeutic Interventions: 1 on 1 counseling sessions, Psychoeducation, Medication administration, Evaluate responses to treatment, Monitor vital signs and CBGs as ordered, Perform/monitor CIWA, COWS, AIMS and Fall Risk screenings as ordered, Perform wound care treatments as ordered.  Evaluation of Outcomes: Not Met   LCSW Treatment Plan for Primary Diagnosis: Bipolar 1 disorder, depressed ([place]) Long Term Goal(s): Safe transition to appropriate next level of care at discharge, Engage patient in therapeutic group addressing interpersonal concerns.  Short Term Goals: Engage patient in aftercare planning with referrals and resources  Therapeutic Interventions: Assess for all discharge needs, 1 to 1 time with Social worker, Explore available resources and support systems, Assess for adequacy in community support network, Educate family and significant other(s) on suicide prevention, Complete Psychosocial Assessment, Interpersonal group therapy.  Evaluation of Outcomes: Not Met   Progress in Treatment: Attending groups: No. Participating in groups: No. Taking medication as prescribed: Yes. Toleration medication: Yes. Family/Significant other contact made: No, will contact:  the patient's significant other Patient understands diagnosis: Yes. Discussing patient identified problems/goals with staff: Yes. Medical problems stabilized or resolved: Yes. Denies suicidal/homicidal ideation: Yes. Issues/concerns per patient self-inventory: No. Other:   New problem(s) identified: None   New Short Term/Long Term Goal(s): medication  stabilization, elimination of SI  thoughts, development of comprehensive mental wellness plan.    Patient Goals:  I dont have a goal for being here.  Discharge Plan or Barriers: Patient plans to return home with her significant other and five children. She plans to follow up with [facility] for outpatient medication management and substance abuse intensive outpatient therapy.   Reason for Continuation of Hospitalization: Depression Hallucinations Medication stabilization Suicidal ideation  Estimated Length of Stay: 3-5 days   Attendees: Patient: 05/04/2018 8:29 AM  Physician: Dr. Neita Garnet, MD 05/04/2018 8:29 AM  Nursing: Nicoletta Dress.Viona Gilmore, RN 05/04/2018 8:29 AM  RN Care Manager: Jackelyn Knife, RN 05/04/2018 8:29 AM  Social Worker: Radonna Ricker, [place] 05/04/2018 8:29 AM  Recreational Therapist: Rhunette Croft 05/04/2018 8:29 AM  Other: Marvia Pickles, NP 05/04/2018 8:29 AM  Other: Agustina Caroli, NP 05/04/2018 8:29 AM  Other: 05/04/2018 8:29 AM    Scribe for Treatment Team: Marylee Floras, [place] 05/04/2018 8:29 AM

## 2018-05-04 NOTE — Progress Notes (Signed)
Recreation Therapy Notes  Date: 10.2.19 Time: 0930 Location: [address] Dayroom  Group Topic: Stress Management  Goal Area(s) Addresses:  Patient will verbalize importance of using healthy stress management.  Patient will identify positive emotions associated with healthy stress management.   Behavioral Response: Engaged  Intervention: Stress Management  Activity :  Guided Imagery.  LRT introduced the stress management technique of guided imagery.  LRT read a script taking the patients on a journey on the beach at sunrise.  Patients were to listen and follow along as script was read.  Education:  Stress Management, Discharge Planning.   Education Outcome: Acknowledges edcuation/In group clarification offered/Needs additional education  Clinical Observations/Feedback: Pt attended and participated in group.    Victorino Sparrow, LRT/CTRS         Ria Comment, Mari Battaglia A 05/04/2018 11:21 AM

## 2018-05-05 LAB — HEMOGLOBIN A1C
Hgb A1c MFr Bld: 6.2 % — ABNORMAL HIGH (ref 4.8–5.6)
Mean Plasma Glucose: 131 mg/dL

## 2018-05-05 LAB — PROLACTIN: PROLACTIN: 13.9 ng/mL (ref 4.8–23.3)

## 2018-05-05 MED ORDER — ZOLPIDEM TARTRATE 5 MG PO TABS: 5.0000 mg | ORAL_TABLET | Freq: Every evening | ORAL | Status: DC | PRN

## 2018-05-05 MED ORDER — SERTRALINE HCL 50 MG PO TABS
50.0000 mg | ORAL_TABLET | Freq: Every day | ORAL | Status: DC
  Filled 2018-05-05 (×2): qty 1

## 2018-05-05 MED ORDER — MIRTAZAPINE 7.5 MG PO TABS
7.5000 mg | ORAL_TABLET | Freq: Every day | ORAL | Status: DC
  Filled 2018-05-05 (×2): qty 1
  Filled 2018-05-05: qty 7
  Filled 2018-05-05: qty 1

## 2018-05-05 MED ORDER — ARIPIPRAZOLE 10 MG PO TABS
10.0000 mg | ORAL_TABLET | Freq: Every day | ORAL | Status: DC
  Administered 2018-05-06: 10 mg via ORAL
  Filled 2018-05-05: qty 1
  Filled 2018-05-05: qty 7
  Filled 2018-05-05: qty 1

## 2018-05-05 NOTE — Progress Notes (Signed)
Adult Psychoeducational Group Note  Date:  05/05/2018 Time:  8:44 PM  Group Topic/Focus:  Wrap-Up Group:   The focus of this group is to help patients review their daily goal of treatment and discuss progress on daily workbooks.  Participation Level:  Active  Participation Quality:  Appropriate  Affect:  Appropriate  Cognitive:  Alert  Insight: Appropriate  Engagement in Group:  Engaged  Modes of Intervention:  Discussion  Additional Comments:  Pt stated that today has been a good day. Her primary goal is to get out the hospital to get back to her kids.   Wynelle Fanny R 05/05/2018, 8:44 PM

## 2018-05-05 NOTE — [facility] Group Notes (Signed)
LCSW Group Therapy Note 05/05/2018 10:07 AM  Type of Therapy and Topic: Group Therapy: Avoiding Self-Sabotaging and Enabling Behaviors  Participation Level: Active  Description of Group:  In this group, patients will learn how to identify obstacles, self-sabotaging and enabling behaviors, as well as: what are they, why do we do them and what needs these behaviors meet. Discuss unhealthy relationships and how to have positive healthy boundaries with those that sabotage and enable. Explore aspects of self-sabotage and enabling in yourself and how to limit these self-destructive behaviors in everyday life.  Therapeutic Goals: 1. Patient will identify one obstacle that relates to self-sabotage and enabling behaviors 2. Patient will identify one personal self-sabotaging or enabling behavior they did prior to admission 3. Patient will state a plan to change the above identified behavior 4. Patient will demonstrate ability to communicate their needs through discussion and/or role play.   Summary of Patient Progress:  Felicia Foster was engaged and participated throughout the group session. Felicia Foster reports that "I am my own personal bad guy". Felicia Foster states that she learned her self sabotaging behaviors from childhood. Felicia Foster states that she has held onto negative relationships as a form of control because "I knew what I was dealing with versus meeting someone new and not knowing, even if they were a positive influence".     Therapeutic Modalities:  Cognitive Behavioral Therapy Person-Centered Therapy Motivational Interviewing   [place] Clinical Social Worker

## 2018-05-05 NOTE — [facility] Suicide Risk Assessment (Signed)
[place] INPATIENT:  Family/Significant Other Suicide Prevention Education  Suicide Prevention Education:  Contact Attempts: Corky Downs, significant other [phone])  has been identified by the patient as the family member/significant other with whom the patient will be residing, and identified as the person(s) who will aid the patient in the event of a mental health crisis.  With written consent from the patient, two attempts were made to provide suicide prevention education, prior to and/or following the patient's discharge.  We were unsuccessful in providing suicide prevention education.  A suicide education pamphlet was given to the patient to share with family/significant other.  Date and time of first attempt:05/05/2018 / 9:55am  Keyauna Graefe E Aliceson Dolbow 05/05/2018, 9:55 AM

## 2018-05-05 NOTE — Progress Notes (Addendum)
[facility] MD Progress Note  05/05/2018 11:25 AM Felicia Foster  MRN:  [id number]   Subjective: Patient reports partially improved mood, although still describes some depression and feeling "down".  Attributes this at least partially to limited local support system.  Denies suicidal ideations today. She has complained of nasal congestion since admission, now partially improved with Flonase.  Objective: I have discussed case with treatment team and have met with patient. 31 year old single female, lives alone with her children (who are currently with her father), unemployed, on disability, presented due to depression, suicidal ideations and recent overdose on Benadryl.  Reports cocaine and alcohol use in binges. At this time presents with partially improved mood but remains depressed with a relatively constricted affect (does smile briefly at times).  Denies suicidal ideations. Thus far tolerating medications well, does not currently describe medication side effects.  Is felt, however, that nasal congestion may be partially related to trazodone, as she states she did not have this symptom prior to admission. She has been visible on unit, going to some groups, no agitated or disruptive behaviors. Denies suicidal ideations at this time. Labs reviewed- TSH 1.72, HgbA1C 6.2   Principal Problem: Bipolar 1 disorder, depressed ([place]) Diagnosis:   Patient Active Problem List   Diagnosis Date Noted  . Allergic rhinitis [J30.9] 05/04/2018  . Bipolar 1 disorder, depressed ([place]) [F31.9] 05/03/2018  . Status post cesarean section [Z98.891] 08/08/2014  . Previous preterm delivery x 3 (33, 36 and 35 weeks), antepartum [O09.219] 06/18/2014  . Previous cesarean delivery x 4, antepartum [O34.219] 06/18/2014  . History of bipolar disorder [Z86.59] 06/12/2014  . History of gestational diabetes [Z86.32] 06/12/2014  . History of congenital or genetic condition [Z87.798] 06/12/2014  . Limited prenatal care in third  trimester [O09.33]   . Previous pregnancy with fetus that had Trisomy 9 mosaic - terminated [O35.2XX0]   . Gestational diabetes mellitus, antepartum [O24.419]    Total Time spent with patient: 20 minutes  Past Psychiatric History: See H&P  Past Medical History:  Past Medical History:  Diagnosis Date  . Diabetes mellitus without complication ([place])   . Fibroids   . Gestational diabetes   . Hypertension   . Mental disorder    bipolar    Past Surgical History:  Procedure Laterality Date  . CESAREAN SECTION     x 4  . CESAREAN SECTION N/A 08/08/2014   Procedure: CESAREAN SECTION;  Surgeon: Truett Mainland, DO;  Location: [facility];  Service: Obstetrics;  Laterality: N/A;  repeat  . DILATION AND CURETTAGE OF UTERUS     Family History:  Family History  Problem Relation Age of Onset  . Cancer Mother    Family Psychiatric  History: See H&P Social History:  Social History   Substance and Sexual Activity  Alcohol Use Yes  . Alcohol/week: 10.0 - 12.0 standard drinks  . Types: 10 - 12 Cans of beer per week   Comment: Pt admits drinking daily     Social History   Substance and Sexual Activity  Drug Use Yes  . Types: Cocaine    Social History   Socioeconomic History  . Marital status: Single    Spouse name: Not on file  . Number of children: Not on file  . Years of education: Not on file  . Highest education level: Not on file  Occupational History  . Not on file  Social Needs  . Financial resource strain: Not on file  . Food insecurity:  Worry: Not on file    Inability: Not on file  . Transportation needs:    Medical: Not on file    Non-medical: Not on file  Tobacco Use  . Smoking status: Current Some Day Smoker    Packs/day: 0.50    Types: Cigarettes  . Smokeless tobacco: Never Used  . Tobacco comment: Pt declined  Substance and Sexual Activity  . Alcohol use: Yes    Alcohol/week: 10.0 - 12.0 standard drinks    Types: 10 - 12 Cans of beer per week     Comment: Pt admits drinking daily  . Drug use: Yes    Types: Cocaine  . Sexual activity: Yes    Birth control/protection: None  Lifestyle  . Physical activity:    Days per week: Not on file    Minutes per session: Not on file  . Stress: Not on file  Relationships  . Social connections:    Talks on phone: Not on file    Gets together: Not on file    Attends religious service: Not on file    Active member of club or organization: Not on file    Attends meetings of clubs or organizations: Not on file    Relationship status: Not on file  Other Topics Concern  . Not on file  Social History Narrative   ** Merged History Encounter **       Additional Social History:    Pain Medications: See home med list Prescriptions: See home med list Over the Counter: See home med list History of alcohol / drug use?: Yes Longest period of sobriety (when/how long): unknown Negative Consequences of Use: Financial, Personal relationships Name of Substance 1: cocaine 1 - Age of First Use: 32 1 - Amount (size/oz): varies 1 - Frequency: maybe once a week 1 - Duration: ongoing 1 - Last Use / Amount: 04/30/18 Name of Substance 2: alcohol 2 - Age of First Use: teens 2 - Amount (size/oz): 10-12 cans 2 - Frequency: 4 or more times a week 2 - Duration: ongoing 2 - Last Use / Amount: this week   Sleep: Improving  Appetite:  Improving  Current Medications: Current Facility-Administered Medications  Medication Dose Route Frequency Provider Last Rate Last Dose  . acetaminophen (TYLENOL) tablet 650 mg  650 mg Oral Q6H PRN Elmarie Shiley A, NP   650 mg at 05/03/18 1555  . alum & mag hydroxide-simeth (MAALOX/MYLANTA) 200-200-20 MG/5ML suspension 30 mL  30 mL Oral Q4H PRN Elmarie Shiley A, NP      . amLODipine (NORVASC) tablet 10 mg  10 mg Oral Daily Ashleyanne Hemmingway, Myer Peer, MD   10 mg at 05/05/18 0746  . [START ON 05/06/2018] ARIPiprazole (ABILIFY) tablet 10 mg  10 mg Oral Daily Advait Buice A, MD      .  fluticasone (FLONASE) 50 MCG/ACT nasal spray 2 spray  2 spray Each Nare BID Money, Lowry Ram, FNP   2 spray at 05/05/18 1101  . hydrOXYzine (ATARAX/VISTARIL) tablet 25 mg  25 mg Oral Q6H PRN Niel Hummer, NP   25 mg at 05/04/18 2132  . lisinopril (PRINIVIL,ZESTRIL) tablet 10 mg  10 mg Oral Daily Braylyn Eye, Myer Peer, MD   10 mg at 05/05/18 0745  . magnesium hydroxide (MILK OF MAGNESIA) suspension 30 mL  30 mL Oral Daily PRN Elmarie Shiley A, NP      . nicotine (NICODERM CQ - dosed in mg/24 hours) patch 21 mg  21 mg Transdermal Daily Gwenlyn Found,  Rinaldo Ratel, NP   Stopped at 05/03/18 0900  . [START ON 05/06/2018] sertraline (ZOLOFT) tablet 50 mg  50 mg Oral Daily Kastiel Simonian A, MD      . zolpidem (AMBIEN) tablet 5 mg  5 mg Oral QHS PRN Gurjot Brisco, Myer Peer, MD        Lab Results:  Results for orders placed or performed during the hospital encounter of 05/03/18 (from the past 48 hour(s))  TSH     Status: None   Collection Time: 05/04/18  6:46 AM  Result Value Ref Range   TSH 1.728 0.350 - 4.500 uIU/mL    Comment: Performed by a 3rd Generation assay with a functional sensitivity of <=0.01 uIU/mL. Performed at [facility], [place] [address] [place], [place] [address]   Hemoglobin A1c     Status: Abnormal   Collection Time: 05/04/18  6:46 AM  Result Value Ref Range   Hgb A1c MFr Bld 6.2 (H) 4.8 - 5.6 %    Comment: (NOTE)         Prediabetes: 5.7 - 6.4         Diabetes: >6.4         Glycemic control for adults with diabetes: <7.0    Mean Plasma Glucose 131 mg/dL    Comment: (NOTE) Performed At: [facility] [place], [place] [id number] Rush Farmer MD [id number]   Lipid panel     Status: Abnormal   Collection Time: 05/04/18  6:46 AM  Result Value Ref Range   Cholesterol 138 0 - 200 mg/dL   Triglycerides 153 (H) <150 mg/dL   HDL 38 (L) >40 mg/dL   Total CHOL/HDL Ratio 3.6 RATIO   VLDL 31 0 - 40 mg/dL   LDL Cholesterol 69 0 - 99 mg/dL    Comment:         Total Cholesterol/HDL:CHD Risk Coronary Heart Disease Risk Table                     Men   Women  1/2 Average Risk   3.4   3.3  Average Risk       5.0   4.4  2 X Average Risk   9.6   7.1  3 X Average Risk  23.4   11.0        Use the calculated Patient Ratio above and the CHD Risk Table to determine the patient's CHD Risk.        ATP III CLASSIFICATION (LDL):  <100     mg/dL   Optimal  100-129  mg/dL   Near or Above                    Optimal  130-159  mg/dL   Borderline  160-189  mg/dL   High  >190     mg/dL   Very High Performed at [facility] [address] [place], [place] [address]   Prolactin     Status: None   Collection Time: 05/04/18  6:46 AM  Result Value Ref Range   Prolactin 13.9 4.8 - 23.3 ng/mL    Comment: (NOTE) Performed At: [facility] [place], [place] [id number] Rush Farmer MD [id number]     Blood Alcohol level:  Lab Results  Component Value Date   [facility] <10 48/54/6270    Metabolic Disorder Labs: Lab Results  Component Value Date   HGBA1C 6.2 (H) 05/04/2018   MPG 131  05/04/2018   MPG 126 (H) 06/25/2014   Lab Results  Component Value Date   PROLACTIN 13.9 05/04/2018   Lab Results  Component Value Date   CHOL 138 05/04/2018   TRIG 153 (H) 05/04/2018   HDL 38 (L) 05/04/2018   CHOLHDL 3.6 05/04/2018   VLDL 31 05/04/2018   LDLCALC 69 05/04/2018    Physical Findings: AIMS: Facial and Oral Movements Muscles of Facial Expression: None, normal Lips and Perioral Area: None, normal Jaw: None, normal Tongue: None, normal,Extremity Movements Upper (arms, wrists, hands, fingers): None, normal Lower (legs, knees, ankles, toes): None, normal, Trunk Movements Neck, shoulders, hips: None, normal, Overall Severity Severity of abnormal movements (highest score from questions above): None, normal Incapacitation due to abnormal movements: None, normal Patient's awareness of abnormal movements (rate  only patient's report): No Awareness, Dental Status Current problems with teeth and/or dentures?: No Does patient usually wear dentures?: No  CIWA:  CIWA-Ar Total: 8 COWS:  COWS Total Score: 7  Musculoskeletal: Strength & Muscle Tone: within normal limits Gait & Station: normal Patient leans: N/A  Psychiatric Specialty Exam: Physical Exam  Nursing note and vitals reviewed. Constitutional: She is oriented to person, place, and time. She appears well-developed and well-nourished.  Cardiovascular: Normal rate.  Respiratory: Effort normal.  Musculoskeletal: Normal range of motion.  Neurological: She is alert and oriented to person, place, and time.  Skin: Skin is warm.    Review of Systems  Constitutional: Negative.   HENT: Positive for congestion and sinus pain.   Eyes: Negative.   Respiratory: Negative.   Cardiovascular: Negative.   Gastrointestinal: Negative.   Genitourinary: Negative.   Musculoskeletal: Negative.   Skin: Negative.   Neurological: Negative.   Endo/Heme/Allergies: Negative.   Reports partially improved nasal congestion, of note denies odynophagia, otalgia, or coughing.  No chest pain, no shortness of breath  Blood pressure 119/89, pulse 96, temperature 99.7 F (37.6 C), temperature source Oral, resp. rate 18, height '5\' 7"'  (1.702 m), weight 122.9 kg, last menstrual period 04/12/2018, SpO2 100 %.Body mass index is 42.44 kg/m.  General Appearance: Fairly Groomed  Eye Contact:  Good  Speech:  Normal Rate  Volume:  Normal  Mood:  Partially improved mood, remains depressed  Affect:  Constricted, but becoming more reactive, smiles briefly at times  Thought Process:  Goal Directed and Descriptions of Associations: Intact  Orientation:  Full (Time, Place, and Person)  Thought Content:  No hallucinations, no delusions  Suicidal Thoughts:  No-denies suicidal or self-injurious ideations, denies homicidal or violent ideations  Homicidal Thoughts:  No  Memory:  Recent  and remote grossly intact  Judgement:  Other:  Improving  Insight:  Fair/improving  Psychomotor Activity:  Normal  Concentration:  Concentration: Good and Attention Span: Good  Recall:  Good  Fund of Knowledge:  Good  Language:  Good  Akathisia:  No  Handed:  Right  AIMS (if indicated):     Assets:  Communication Skills Desire for Improvement Social Support  ADL's:  Intact  Cognition:  WNL  Sleep:  Number of Hours: 6.75   Assessment - 32 year old single female, lives alone with her children (who are currently with her father), unemployed, on disability, presented due to depression, suicidal ideations and recent overdose on Benadryl.  Reports cocaine and alcohol use in binges.  Patient reporting some improvement compared to admission but overall still describing a sense of depression and sadness/anxiety which she attributes to poor family and social support network.  Denies suicidal ideations.  Thus far tolerating medications well. She has complained of nasal congestion, which she states started at admission, and which is not associated with other symptoms of an upper respiratory infection at this time.  It has improved partially with Flonase, it is felt Trazodone may be contributing.  Treatment Plan Summary: Daily contact with patient to assess and evaluate symptoms and progress in treatment, Medication management and Plan is to:  Treatment plan reviewed as below today 10/3 Encourage group and milieu participation to work on coping skills and symptom reduction Encourage efforts to work on sobriety and relapse prevention Treatment team working on disposition planning Increase Abilify to 10 mg p.o. daily for mood disorder Continue Vistaril 25 mg p.o. every 6 hours as needed for anxiety Increase Zoloft to 50  p.o. daily for depression and anxiety D/C Trazodone  Start Ambien 5 mg nightly PRN for insomnia as needed Continue Flonase as needed Dietary cosultantion    Jenne Campus,  MD 05/05/2018, 11:25 AM   Patient ID: Felicia Foster, female   DOB: Apr 30, 1986, 32 y.o.   MRN: [id number]

## 2018-05-05 NOTE — Progress Notes (Signed)
Pt attended goals and orientation group this morning. Pt goal for the day is to handle outside business, by making appropriate phone calls

## 2018-05-05 NOTE — Progress Notes (Signed)
D: Patient denies SI, HI or AVH today, states that she was having AH yesterday. Patient presents as flat and depressed but cooperative.  She states that she did not sleep well last night due to noise such as toilets flushing etc.  Pt. Is visualized on the unit interacting with her peers and is attending/participating in groups.  Pt. States that her appetite is good and only complains of some sinus congestion.    A: Patient given emotional support from RN. Patient encouraged to come to staff with concerns and/or questions. Patient's medication routine continued. Patient's orders and plan of care reviewed.   R: Patient remains appropriate and cooperative. Will continue to monitor patient q15 minutes for safety.

## 2018-05-06 MED ORDER — ARIPIPRAZOLE 10 MG PO TABS: 10.0000 mg | ORAL_TABLET | Freq: Every day | ORAL | 0 refills | Status: DC

## 2018-05-06 MED ORDER — MIRTAZAPINE 7.5 MG PO TABS: 7.5000 mg | ORAL_TABLET | Freq: Every day | ORAL | 0 refills | Status: DC

## 2018-05-06 MED ORDER — AMLODIPINE BESYLATE 10 MG PO TABS: 10.0000 mg | ORAL_TABLET | Freq: Every day | ORAL | 0 refills | Status: DC

## 2018-05-06 MED ORDER — LISINOPRIL 10 MG PO TABS: 10.0000 mg | ORAL_TABLET | Freq: Every day | ORAL | 0 refills | Status: DC

## 2018-05-06 NOTE — Progress Notes (Signed)
NUTRITION NOTE  Consult received: 32 yo female, HgbA1C on prediabetic range, interested in learning more regarding appropriate dietary choices.  Patient is unemployed and on disability and was admitted d/t depression and SI with a recent OD on Benadryl. She had reported that she goes on alcohol and cocaine binges. Patient is currently on a Regular diet. HgbA1c: 6.2%.  Wt Readings from Last 10 Encounters:  05/03/18 122.9 kg  03/30/18 124.3 kg  12/28/14 120.2 kg  11/01/14 120 kg  08/22/14 111.8 kg  08/18/14 105.7 kg  08/15/14 116.8 kg  08/08/14 122.5 kg  08/01/14 124.1 kg  07/23/14 122.3 kg    RD unable to meet with patient in person today. Will place diet education handouts in Discharge Instructions. Please re-consult if additional nutrition-related issues arise.     Jarome Matin, MS, RD, LDN, [facility] Inpatient Clinical Dietitian Pager # [phone] After hours/weekend pager # [phone]

## 2018-05-06 NOTE — Progress Notes (Signed)
Nursing Progress Note: 7p-7a D: Pt currently presents with a depressed/anxious affect and behavior. Pt states "I just want to sleep." Interacting minimally with the milieu. Pt reports fair sleep during the previous night with current medication regimen. Pt did attend wrap-up group.  A: Pt provided with medications per providers orders. Pt's labs and vitals were monitored throughout the night. Pt supported emotionally and encouraged to express concerns and questions. Pt educated on medications.  R: Pt's safety ensured with 15 minute and environmental checks. Pt currently denies SI, HI, and AVH. Pt verbally contracts to seek staff if SI,HI, or AVH occurs and to consult with staff before acting on any harmful thoughts. Will continue to monitor.

## 2018-05-06 NOTE — [facility] Suicide Risk Assessment (Signed)
[place] INPATIENT:  Family/Significant Other Suicide Prevention Education  Suicide Prevention Education:   SPE completed with patient, as patient refused to consent to family contact. Patient was encouraged to share information with support network, ask questions, and talk about any concerns relating to SPE. Patient denies access to guns/firearms and verbalized understanding of information provided. Mobile Crisis information also provided to patient.

## 2018-05-06 NOTE — Discharge Instructions (Signed)
Diabetes Label Reading Tips Check the Nutrition Facts on food labels for nutrient information for the food. The Nutrition Facts information is based on a standard serving size. However, that serving size may not be the same as the serving size used in carbohydrate counting. Always start by checking the serving size on the label. Is this the serving size you will be eating? How many servings are in the package? Next, look at the total carbohydrate. It is measured in grams (g). To find the number of carbohydrate servings in 1 standard serving of a food, divide the total grams of carbohydrate by 15. One (1) carbohydrate serving is the amount of food with 15 g carbohydrate. You dont need to count grams of sugars. They are included in the total carbohydrate. The label shows how many calories are in the standard serving. It also lists the amount of fat, cholesterol, sodium, protein, and some vitamins and minerals. Talk to your registered dietitian nutritionist or diabetes educator to learn about your goals for these nutrients. Look below the line listing total fat to find out how much of that fat is saturated fat or trans fat. Choose foods that are low in these kinds of fats because they are not healthy for your heart. In the foods that are healthiest for your heart, grams of saturated fat and trans fat are less than one-third of the total fat grams. If foods are very low in calories (less than 20 calories per serving) or carbohydrates (5 g carbohydrate or less per serving), you may not need to count them when you count carbohydrates. Ask your registered dietitian nutritionist or diabetes educator about these free foods.   Carbohydrate Counting for People with Diabetes Why Is Carbohydrate Counting Important? Counting carbohydrate servings may help you control your blood glucose level so that you feel better. The balance between the carbohydrates you eat and insulin determines what your blood  glucose level will be after Eating. Carbohydrate counting can also help you plan your meals.  Which Foods Have Carbohydrates? Foods with carbohydrates include: Breads, crackers, and cereals Pasta, rice, and grains Starchy vegetables, such as potatoes, corn, and peas Beans and legumes Milk, soy milk, and yogurt Fruits and fruit juices Sweets, such as cakes, cookies, ice cream, jam, and jelly  Carbohydrate Servings In diabetes meal planning, 1 serving of a food with carbohydrate has about 15 grams of carbohydrate: Check serving sizes with measuring cups and spoons or a food scale. Read the Nutrition Facts on food labels to find out how many grams of carbohydrate are in foods you eat. The food lists in this handout show portions that have about 15 grams of carbohydrate.   Meal Planning Tips An Eating Plan tells you how many carbohydrate servings to eat at your meals and snacks. For many adults, eating 3 to 5 servings of carbohydrate foods at each meal and 1 or 2 carbohydrate servings for each snack works well. In a healthy daily Eating Plan, most carbohydrates come from: At least 6 servings of fruits and nonstarchy vegetables At least 6 servings of grains, beans, and starchy vegetables, with at least 3 servings from whole grains At least 2 servings of milk or milk products Check your blood glucose level regularly. It can tell you if you need to adjust when you eat carbohydrates. Eating foods that have fiber, such as whole grains, and having very few salty foods is good for your health. Eat 4 to 6 ounces of meat or other protein foods (such  as soybean burgers) each day. Choose low-fat sources of protein, such as lean beef, lean pork, chicken, fish, low-fat cheese, or vegetarian foods such as soy. Eat some healthy fats, such as olive oil, canola oil, and nuts. Eat very little saturated fats. These unhealthy fats are found in butter, cream, and high-fat meats, such as bacon  and sausage. Eat very little or no trans fats. These unhealthy fats are found in all foods that list partially hydrogenated oil as an ingredient. Label Reading Tips The Nutrition Facts panel on a label lists the grams of total carbohydrate in 1 standard serving. The labels standard serving may be larger or smaller than 1 carbohydrate serving. To figure out how many carbohydrate servings are in the food: First, look at the labels standard serving size. Check the grams of total carbohydrate. This is the amount of carbohydrate in 1 standard serving. Divide the grams of total carbohydrate by 15. This number equals the number of carbohydrate servings in 1 standard serving. Remember: 1 carbohydrate serving is 15 grams of carbohydrate. Note: You may ignore the grams of sugars on the Nutrition Facts panel because they are included in the grams of total carbohydrate.  Foods Recommended 1 serving = about 15 grams of carbohydrate Starches 1 slice bread (1 ounce) 1 tortilla (6-inch size)  large bagel (1 ounce) 2 taco shells (5-inch size)  hamburger or hot dog bun ( ounce)  cup ready-to-eat unsweetened cereal  cup cooked cereal 1 cup broth-based soup 4 to 6 small crackers 1/3 cup pasta or rice (cooked)  cup beans, peas, corn, sweet potatoes, winter squash, or mashed or boiled potatoes (cooked)  large baked potato (3 ounces)  ounce pretzels, potato chips, or tortilla chips 3 cups popcorn (popped) Fruit 1 small fresh fruit ( to 1 cup)  cup canned or frozen fruit 2 tablespoons dried fruit (blueberries, cherries, cranberries, mixed fruit, raisins) 17 small grapes (3 ounces) 1 cup melon or berries  cup unsweetened fruit juice Milk 1 cup fat-free or reduced-fat milk 1 cup soy milk 2/3 cup (6 ounces) nonfat yogurt sweetened with sugar-free sweetener

## 2018-05-06 NOTE — Progress Notes (Signed)
Pt received both written and verbal discharge instructions. Pt verbalized understanding of discharge instructions. Pt agreed to f/u appt and med regimen. Pt received d/c packet, prescriptions, sample meds, a letter of hospitalization and belongings. Pt safely discharged to the lobby.

## 2018-05-06 NOTE — BHH Suicide Risk Assessment (Signed)
[facility] Discharge Suicide Risk Assessment   Principal Problem: Bipolar 1 disorder, depressed [facility]) Discharge Diagnoses:  Patient Active Problem List   Diagnosis Date Noted  . Allergic rhinitis [J30.9] 05/04/2018  . Bipolar 1 disorder, depressed ([place]) [F31.9] 05/03/2018  . Status post cesarean section [Z98.891] 08/08/2014  . Previous preterm delivery x 3 (33, 36 and 35 weeks), antepartum [O09.219] 06/18/2014  . Previous cesarean delivery x 4, antepartum [O34.219] 06/18/2014  . History of bipolar disorder [Z86.59] 06/12/2014  . History of gestational diabetes [Z86.32] 06/12/2014  . History of congenital or genetic condition [Z87.798] 06/12/2014  . Limited prenatal care in third trimester [O09.33]   . Previous pregnancy with fetus that had Trisomy 9 mosaic - terminated [O35.2XX0]   . Gestational diabetes mellitus, antepartum [O24.419]     Total Time spent with patient: 30 minutes  Musculoskeletal: Strength & Muscle Tone: within normal limits Gait & Station: normal Patient leans: N/A  Psychiatric Specialty Exam: ROS no headache, describes improved nasal congestion, now breathing nasally, no chest pain, no shortness of breath, no vomiting  Blood pressure (!) 139/97, pulse 95, temperature 99.2 F (37.3 C), temperature source Oral, resp. rate 16, height 5\' 7"  (1.702 m), weight 122.9 kg, last menstrual period 04/12/2018, SpO2 100 %.Body mass index is 42.44 kg/m.  General Appearance: Improving grooming  Eye Contact::  Good  Speech:  Normal Rate409  Volume:  Normal  Mood:  Reports improving mood, states she feels better today  Affect:  Becoming more reactive, smiles at times appropriately  Thought Process:  Linear and Descriptions of Associations: Intact  Orientation:  Full (Time, Place, and Person)  Thought Content:  Denies hallucinations, no delusions expressed  Suicidal Thoughts:  No-denies suicidal or self-injurious ideations  Homicidal Thoughts:  No-denies homicidal or violent  ideations  Memory:  Recent and remote grossly intact  Judgement:  Other:  improving   Insight:  improving   Psychomotor Activity:  Normal  Concentration:  Good  Recall:  Good  Fund of Knowledge:Good  Language: Good  Akathisia:  Negative  Handed:  Right  AIMS (if indicated):     Assets:  Desire for Improvement Resilience  Sleep:  Number of Hours: 6.25  Cognition: WNL  ADL's:  Intact   Mental Status Per Nursing Assessment::   On Admission:  Suicidal ideation indicated by patient, Self-harm thoughts  Demographic Factors:  65, single, lives with her children, who have been with their father while patient is in the hospital, unemployed, on disability  Loss Factors: Disability, limited local support network, substance abuse  Historical Factors: History of prior psychiatric admissions as a teenager, history of suicide attempt at age 77, prior diagnoses of bipolar disorder, history of alcohol and cocaine use in binges.  Risk Reduction Factors:   Responsible for children under 85 years of age, Sense of responsibility to family and Positive coping skills or problem solving skills  Continued Clinical Symptoms:  Patient presents alert, attentive, calm, reports improving mood, states that she is feeling significantly better than she did prior to admission, affect is more reactive, smiles at times appropriately, no thought disorder, not suicidal, not homicidal, no hallucinations, no delusions, future oriented.  Reports interest in improving lifestyle, healthier diet, maintaining sobriety. Behavior on unit in good control, polite/pleasant on approach. Denies medication side effects. Side effects reviewed, including risk of movement/metabolic side effects on Abilify, sedation on Remeron   Cognitive Features That Contribute To Risk:  No gross cognitive deficits noted upon discharge. Is alert , attentive, and oriented  x 3   Suicide Risk:  Mild:  Suicidal ideation of limited frequency,  intensity, duration, and specificity.  There are no identifiable plans, no associated intent, mild dysphoria and related symptoms, good self-control (both objective and subjective assessment), few other risk factors, and identifiable protective factors, including available and accessible social support.  [place]. Go on 05/10/2018.   Specialty:  Behavioral Health Why:  Appointment for medication management and substance abuse intensive outpatient services is Tuesday, 05/10/18 at 11:00am. Please be sure to bring your Photo ID, SSN, any insurance information and any discharge paperwork including your medications.  Contact information: [address] [place] [address] [phone]           Plan Of Care/Follow-up recommendations:  Activity:  As tolerated Diet:  heart healthy Tests:  NA Other:  See below  Patient requesting discharge, no current grounds for involuntary commitment. Leaving unit in good spirits  Plans to return home. Follow up as above .  Jenne Campus, MD 05/06/2018, 11:04 AM

## 2018-05-06 NOTE — Progress Notes (Signed)
Recreation Therapy Notes  Date: 10.4.19 Time: 0930 Location: [address] Dayroom  Group Topic: Stress Management  Goal Area(s) Addresses:  Patient will verbalize importance of using healthy stress management.  Patient will identify positive emotions associated with healthy stress management.   Behavioral Response: Engaged  Intervention: Stress Management  Activity :  Patients were introduced to ways in which they can relieve stress.   Education:  Stress Management, Discharge Planning.   Education Outcome: Acknowledges edcuation/In group clarification offered/Needs additional education  Clinical Observations/Feedback: Pt attended group.     Victorino Sparrow, LRT/CTRS         Victorino Sparrow A 05/06/2018 11:41 AM
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
## 2018-05-06 NOTE — Progress Notes (Signed)
  [facility] Adult Case Management Discharge Plan :  Will you be returning to the same living situation after discharge:  Yes,  patient reports she is returning home with her children at discharge At discharge, do you have transportation home?: Yes,  patient reports her best friend will pick her up at discharge Do you have the ability to pay for your medications: Yes,  Medicare; SSDI  Release of information consent forms completed and in the chart;  Patient's signature needed at discharge.  Patient to Follow up at: [place]. Go on 05/10/2018.   Specialty:  Behavioral Health Why:  Appointment for medication management and substance abuse intensive outpatient services is Tuesday, 05/10/18 at 11:00am. Please be sure to bring your Photo ID, SSN, any insurance information and any discharge paperwork including your medications.  Contact information: [place] [address] [phone]           Next level of care provider has access to [place] and Suicide Prevention discussed: Yes,  with the patient  Have you used any form of tobacco in the last 30 days? (Cigarettes, Smokeless Tobacco, Cigars, and/or Pipes): Yes  Has patient been referred to the Quitline?: Patient refused referral  Patient has been referred for addiction treatment: Yes  Felicia Foster, [place] 05/06/2018, 9:16 AM

## 2018-05-06 NOTE — BHH Suicide Risk Assessment (Signed)
[place] INPATIENT:  Family/Significant Other Suicide Prevention Education  Suicide Prevention Education:  Contact Attempts: Corky Downs, significant other [phone])  has been identified by the patient as the family member/significant other with whom the patient will be residing, and identified as the person(s) who will aid the patient in the event of a mental health crisis.  With written consent from the patient, two attempts were made to provide suicide prevention education, prior to and/or following the patient's discharge.  We were unsuccessful in providing suicide prevention education.  A suicide education pamphlet was given to the patient to share with family/significant other.   Date and time of second attempt:05/06/18 / 9:15am     Arisbeth Purrington E Coleson Kant 05/06/2018, 9:15 AM

## 2018-06-20 ENCOUNTER — Encounter (HOSPITAL_COMMUNITY): Payer: Self-pay | Admitting: Neurology

## 2018-06-20 ENCOUNTER — Emergency Department (HOSPITAL_COMMUNITY): Payer: Medicare Other

## 2018-06-20 ENCOUNTER — Emergency Department (HOSPITAL_COMMUNITY)
Admission: EM | Admit: 2018-06-20 | Discharge: 2018-06-20 | Disposition: A | Discharge status: Home / Self Care | Payer: Medicare Other | Attending: Emergency Medicine | Admitting: Emergency Medicine

## 2018-06-20 DIAGNOSIS — E119 Type 2 diabetes mellitus without complications: Secondary | ICD-10-CM | POA: Insufficient documentation

## 2018-06-20 DIAGNOSIS — F17210 Nicotine dependence, cigarettes, uncomplicated: Secondary | ICD-10-CM | POA: Insufficient documentation

## 2018-06-20 DIAGNOSIS — R072 Precordial pain: Secondary | ICD-10-CM | POA: Diagnosis not present

## 2018-06-20 DIAGNOSIS — I10 Essential (primary) hypertension: Secondary | ICD-10-CM

## 2018-06-20 DIAGNOSIS — R110 Nausea: Secondary | ICD-10-CM | POA: Diagnosis not present

## 2018-06-20 DIAGNOSIS — Z79899 Other long term (current) drug therapy: Secondary | ICD-10-CM | POA: Diagnosis not present

## 2018-06-20 DIAGNOSIS — R0789 Other chest pain: Secondary | ICD-10-CM

## 2018-06-20 DIAGNOSIS — R1084 Generalized abdominal pain: Secondary | ICD-10-CM | POA: Diagnosis not present

## 2018-06-20 DIAGNOSIS — R079 Chest pain, unspecified: Secondary | ICD-10-CM | POA: Diagnosis not present

## 2018-06-20 LAB — CBC
HCT: 36.4 % (ref 36.0–46.0)
Hemoglobin: 10.9 g/dL — ABNORMAL LOW (ref 12.0–15.0)
MCH: 23.3 pg — ABNORMAL LOW (ref 26.0–34.0)
MCHC: 29.9 g/dL — ABNORMAL LOW (ref 30.0–36.0)
MCV: 77.8 fL — ABNORMAL LOW (ref 80.0–100.0)
NRBC: 0 % (ref 0.0–0.2)
PLATELETS: 349 10*3/uL (ref 150–400)
RBC: 4.68 MIL/uL (ref 3.87–5.11)
RDW: 16.9 % — ABNORMAL HIGH (ref 11.5–15.5)
WBC: 8.6 10*3/uL (ref 4.0–10.5)

## 2018-06-20 LAB — BASIC METABOLIC PANEL
ANION GAP: 7 (ref 5–15)
BUN: 13 mg/dL (ref 6–20)
CALCIUM: 8.8 mg/dL — AB (ref 8.9–10.3)
CO2: 23 mmol/L (ref 22–32)
Chloride: 111 mmol/L (ref 98–111)
Creatinine, Ser: 1.03 mg/dL — ABNORMAL HIGH (ref 0.44–1.00)
Glucose, Bld: 113 mg/dL — ABNORMAL HIGH (ref 70–99)
Potassium: 3.8 mmol/L (ref 3.5–5.1)
SODIUM: 141 mmol/L (ref 135–145)

## 2018-06-20 LAB — I-STAT BETA HCG BLOOD, ED (MC, WL, AP ONLY)

## 2018-06-20 LAB — I-STAT TROPONIN, ED: TROPONIN I, POC: 0 ng/mL (ref 0.00–0.08)

## 2018-06-20 MED ORDER — ACETAMINOPHEN 325 MG PO TABS
650.0000 mg | ORAL_TABLET | Freq: Once | ORAL | Status: AC
  Administered 2018-06-20: 650 mg via ORAL
  Filled 2018-06-20: qty 2

## 2018-06-20 MED ORDER — CLONIDINE HCL 0.2 MG PO TABS
0.2000 mg | ORAL_TABLET | Freq: Once | ORAL | Status: AC
  Administered 2018-06-20: 0.2 mg via ORAL
  Filled 2018-06-20: qty 1

## 2018-06-20 NOTE — ED Provider Notes (Signed)
[facility] EMERGENCY DEPARTMENT Provider Note   CSN: [id number] Arrival date & time: 06/20/18  0930     History   Chief Complaint Chief Complaint  Patient presents with  . Chest Pain  . Abdominal Pain    HPI Felicia Foster is a 32 y.o. female.  She is a history of hypertension and diabetes.  She said she woke up this morning with ringing in her ears and then had some spots in her vision.  She then felt tightness and pain in her chest and short of breath.  The symptoms lasted about 10 minutes.  She was very panicked with the feeling and called 911.  Currently her symptoms have completely resolved.  She asked me if she had a panic attack.  She said she has had some increased stress over the last few days.  She has been taking her medications regular.  The history is provided by the patient.  Chest Pain   This is a new problem. The current episode started less than 1 hour ago. The problem has been resolved. The pain is associated with an emotional upset. The pain is present in the substernal region. The pain is moderate. The quality of the pain is described as pressure-like. The pain does not radiate. Associated symptoms include abdominal pain and shortness of breath. Pertinent negatives include no cough, no fever, no headaches, no hemoptysis, no nausea and no vomiting. She has tried rest and nitroglycerin for the symptoms. The treatment provided significant relief. Risk factors include smoking/tobacco exposure.  Her past medical history is significant for diabetes and hypertension.  Abdominal Pain   Pertinent negatives include fever, nausea, vomiting, dysuria and headaches.    Past Medical History:  Diagnosis Date  . Diabetes mellitus without complication ([place])   . Fibroids   . Gestational diabetes   . Hypertension   . Mental disorder    bipolar    Patient Active Problem List   Diagnosis Date Noted  . Allergic rhinitis 05/04/2018  . Bipolar 1 disorder,  depressed ([place]) 05/03/2018  . Status post cesarean section 08/08/2014  . Previous preterm delivery x 3 (33, 36 and 35 weeks), antepartum 06/18/2014  . Previous cesarean delivery x 4, antepartum 06/18/2014  . History of bipolar disorder 06/12/2014  . History of gestational diabetes 06/12/2014  . History of congenital or genetic condition 06/12/2014  . Limited prenatal care in third trimester   . 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 Gestational diabetes mellitus, antepartum     Past Surgical History:  Procedure Laterality Date  . CESAREAN SECTION     x 4  . CESAREAN SECTION N/A 08/08/2014   Procedure: CESAREAN SECTION;  Surgeon: Truett Mainland, DO;  Location: [facility];  Service: Obstetrics;  Laterality: N/A;  repeat  . DILATION AND CURETTAGE OF UTERUS       OB History    Gravida  8   Para  5   Term  2   Preterm  3   AB  3   Living  5     SAB  1   TAB  2   Ectopic  0   Multiple  0   Live Births  5            Home Medications    Prior to Admission medications   Medication Sig Start Date End Date Taking? Authorizing Provider  amLODipine (NORVASC) 10 MG tablet Take 1 tablet (  10 mg total) by mouth daily. 05/07/18   Money, Lowry Ram, FNP  ARIPiprazole (ABILIFY) 10 MG tablet Take 1 tablet (10 mg total) by mouth daily. For mood control 05/07/18   Money, Darnelle Maffucci B, FNP  lisinopril (PRINIVIL,ZESTRIL) 10 MG tablet Take 1 tablet (10 mg total) by mouth daily. 05/07/18   Money, Lowry Ram, FNP  mirtazapine (REMERON) 7.5 MG tablet Take 1 tablet (7.5 mg total) by mouth at bedtime. For mood control 05/06/18   Money, Lowry Ram, FNP    Family History Family History  Problem Relation Age of Onset  . Cancer Mother     Social History Social History   Tobacco Use  . Smoking status: Current Some Day Smoker    Packs/day: 0.50    Types: Cigarettes  . Smokeless tobacco: Never Used  . Tobacco comment: Pt declined  Substance Use Topics  . Alcohol  use: Yes    Alcohol/week: 10.0 - 12.0 standard drinks    Types: 10 - 12 Cans of beer per week    Comment: Pt admits drinking daily  . Drug use: Yes    Types: Cocaine     Allergies   Patient has no known allergies.   Review of Systems Review of Systems  Constitutional: Negative for fever.  HENT: Positive for tinnitus. Negative for sore throat.   Eyes: Positive for visual disturbance.  Respiratory: Positive for shortness of breath. Negative for cough and hemoptysis.   Cardiovascular: Positive for chest pain.  Gastrointestinal: Positive for abdominal pain. Negative for nausea and vomiting.  Genitourinary: Negative for dysuria.  Musculoskeletal: Negative for neck pain.  Skin: Negative for rash.  Neurological: Negative for headaches.     Physical Exam Updated Vital Signs BP (!) 166/97 (BP Location: Right Arm)   Pulse 93   Temp 98.2 F (36.8 C) (Oral)   Resp 20   Ht 5\' 6"  (1.676 m)   Wt 124.7 kg   LMP 06/20/2018   SpO2 100%   BMI 44.39 kg/m   Physical Exam  Constitutional: She appears well-developed and well-nourished. No distress.  HENT:  Head: Normocephalic and atraumatic.  Eyes: Conjunctivae are normal.  Neck: Neck supple.  Cardiovascular: Normal rate and regular rhythm.  No murmur heard. Pulmonary/Chest: Effort normal and breath sounds normal. No respiratory distress.  Abdominal: Soft. There is no tenderness.  Musculoskeletal: She exhibits no edema.       Right lower leg: She exhibits no tenderness and no edema.       Left lower leg: She exhibits no tenderness and no edema.  Neurological: She is alert.  Skin: Skin is warm and dry. Capillary refill takes less than 2 seconds.  Psychiatric: She has a normal mood and affect.  Nursing note and vitals reviewed.    ED Treatments / Results  Labs (all labs ordered are listed, but only abnormal results are displayed) Labs Reviewed  BASIC METABOLIC PANEL - Abnormal; Notable for the following components:       Result Value   Glucose, Bld 113 (*)    Creatinine, Ser 1.03 (*)    Calcium 8.8 (*)    All other components within normal limits  CBC - Abnormal; Notable for the following components:   Hemoglobin 10.9 (*)    MCV 77.8 (*)    MCH 23.3 (*)    MCHC 29.9 (*)    RDW 16.9 (*)    All other components within normal limits  I-STAT TROPONIN, ED  I-STAT BETA HCG BLOOD, ED ([facility], [facility],  [facility] ONLY)    EKG EKG Interpretation  Date/Time:  Monday June 20 2018 09:37:45 EST Ventricular Rate:  91 PR Interval:    QRS Duration: 78 QT Interval:  393 QTC Calculation: 484 R Axis:   65 Text Interpretation:  Sinus rhythm Probable left atrial enlargement Borderline prolonged QT interval similar to prior 10/19 Confirmed by Aletta Edouard [phone]) on 06/20/2018 9:46:33 AM   Radiology Dg Chest 2 View  Result Date: 06/20/2018 CLINICAL DATA:  Hypertension and weakness EXAM: CHEST - 2 VIEW COMPARISON:  None. FINDINGS: Lungs are clear. Heart size and pulmonary vascularity are normal. No adenopathy. No pneumothorax. No bone lesions. IMPRESSION: No edema or consolidation. Electronically Signed   By: Lowella Grip III M.D.   On: 06/20/2018 10:23    Procedures Procedures (including critical care time)  Medications Ordered in ED Medications  acetaminophen (TYLENOL) tablet 650 mg (650 mg Oral Given 06/20/18 1229)  cloNIDine (CATAPRES) tablet 0.2 mg (0.2 mg Oral Given 06/20/18 1310)     Initial Impression / Assessment and Plan / ED Course  I have reviewed the triage vital signs and the nursing notes.  Pertinent labs & imaging results that were available during my care of the patient were reviewed by me and considered in my medical decision making (see chart for details).  Clinical Course as of Jun 20 1834  Molli Knock Jun 20, 6372  3358 32 year old female with no prior history of coronary disease here with acute onset of ear ringing visual disturbance chest pain and shortness of breath.  Her symptoms have  completely resolved.  She said they lasted about 8 to 10 minutes.  EMS gave her nitro but she said her symptoms were improving before that.  Her EKG is unremarkable.  She is getting some screening labs and a chest x-ray.  Differential includes ACS, pneumonia, panic attack.   [MB]  6389 Patient's work-up has been unremarkable.  She still has elevated blood pressures here.  She otherwise appears nontoxic.   [MB]  3734 On review her prior blood pressures also seem to be quite high.  She is not under routine care other than with her gynecologist.  She was going to need some you to follow-up with for management of her blood pressure.   [MB]  2876 Blood pressure now improving here with diastolics around 811.  She said she is has known hypertension and is treated with her gynecologist.  Will give her the number for [facility] if she can establish primary care   [MB]    Clinical Course User Index [MB] Hayden Rasmussen, MD      Final Clinical Impressions(s) / ED Diagnoses   Final diagnoses:  Atypical chest pain  Hypertension, unspecified type    ED Discharge Orders    None       Hayden Rasmussen, MD 06/20/18 [phone]

## 2018-06-20 NOTE — Discharge Instructions (Addendum)
You were seen in the emergency department for chest pain shortness of breath.  You had blood work and an EKG that did not show an obvious cause of your symptoms.  Your blood pressure remained elevated here and this will need to be followed up with primary care.  Please continue your regular medications.

## 2018-06-20 NOTE — ED Triage Notes (Signed)
Per ems- pt comes from home, woke up this morning with ringing in her ears, then she felt she had spots in her vision, feeling sob then CP. Has high BP, could have been panic attack? Also, lower abdominal pain, is on her last day of period. Feels like cramps, but feels different. EKG SR BO 178/108, CBG 119, 100%. Given 324 aspirin, 1 nitro, pain reduced from 8/10 to 4/10.

## 2018-07-20 ENCOUNTER — Ambulatory Visit (HOSPITAL_COMMUNITY)
Admission: AD | Admit: 2018-07-20 | Discharge: 2018-07-20 | Disposition: A | Discharge status: Home / Self Care | Payer: Medicare Other | Attending: Psychiatry | Admitting: Psychiatry

## 2018-07-20 DIAGNOSIS — F17210 Nicotine dependence, cigarettes, uncomplicated: Secondary | ICD-10-CM | POA: Diagnosis not present

## 2018-07-20 DIAGNOSIS — Z915 Personal history of self-harm: Secondary | ICD-10-CM | POA: Insufficient documentation

## 2018-07-20 DIAGNOSIS — E119 Type 2 diabetes mellitus without complications: Secondary | ICD-10-CM | POA: Diagnosis not present

## 2018-07-20 DIAGNOSIS — F332 Major depressive disorder, recurrent severe without psychotic features: Secondary | ICD-10-CM | POA: Diagnosis present

## 2018-07-20 DIAGNOSIS — F1499 Cocaine use, unspecified with unspecified cocaine-induced disorder: Secondary | ICD-10-CM | POA: Insufficient documentation

## 2018-07-20 DIAGNOSIS — R45851 Suicidal ideations: Secondary | ICD-10-CM | POA: Diagnosis not present

## 2018-07-20 DIAGNOSIS — R45850 Homicidal ideations: Secondary | ICD-10-CM | POA: Insufficient documentation

## 2018-07-20 DIAGNOSIS — I10 Essential (primary) hypertension: Secondary | ICD-10-CM | POA: Insufficient documentation
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
## 2018-08-04 ENCOUNTER — Encounter (HOSPITAL_COMMUNITY): Payer: Self-pay

## 2018-08-04 ENCOUNTER — Emergency Department (HOSPITAL_COMMUNITY): Payer: Medicare HMO

## 2018-08-04 ENCOUNTER — Emergency Department (HOSPITAL_COMMUNITY)
Admission: EM | Admit: 2018-08-04 | Discharge: 2018-08-04 | Disposition: A | Discharge status: Home / Self Care | Payer: Medicare HMO | Attending: Emergency Medicine | Admitting: Emergency Medicine

## 2018-08-04 DIAGNOSIS — J00 Acute nasopharyngitis [common cold]: Secondary | ICD-10-CM | POA: Insufficient documentation

## 2018-08-04 DIAGNOSIS — I10 Essential (primary) hypertension: Secondary | ICD-10-CM | POA: Diagnosis not present

## 2018-08-04 DIAGNOSIS — R0981 Nasal congestion: Secondary | ICD-10-CM | POA: Diagnosis present

## 2018-08-04 DIAGNOSIS — F17210 Nicotine dependence, cigarettes, uncomplicated: Secondary | ICD-10-CM | POA: Insufficient documentation

## 2018-08-04 DIAGNOSIS — R590 Localized enlarged lymph nodes: Secondary | ICD-10-CM | POA: Diagnosis not present

## 2018-08-04 DIAGNOSIS — Z79899 Other long term (current) drug therapy: Secondary | ICD-10-CM | POA: Diagnosis not present

## 2018-08-04 DIAGNOSIS — E119 Type 2 diabetes mellitus without complications: Secondary | ICD-10-CM | POA: Diagnosis not present

## 2018-08-04 DIAGNOSIS — R0602 Shortness of breath: Secondary | ICD-10-CM | POA: Diagnosis not present

## 2018-08-04 DIAGNOSIS — R591 Generalized enlarged lymph nodes: Secondary | ICD-10-CM

## 2018-08-04 LAB — DIFFERENTIAL
Abs Immature Granulocytes: 0.01 10*3/uL (ref 0.00–0.07)
Basophils Absolute: 0 10*3/uL (ref 0.0–0.1)
Basophils Relative: 0 %
Eosinophils Absolute: 0.2 10*3/uL (ref 0.0–0.5)
Eosinophils Relative: 5 %
IMMATURE GRANULOCYTES: 0 %
Lymphocytes Relative: 54 %
Lymphs Abs: 2.7 10*3/uL (ref 0.7–4.0)
Monocytes Absolute: 0.5 10*3/uL (ref 0.1–1.0)
Monocytes Relative: 9 %
Neutro Abs: 1.7 10*3/uL (ref 1.7–7.7)
Neutrophils Relative %: 32 %

## 2018-08-04 LAB — COMPREHENSIVE METABOLIC PANEL
ALT: 27 U/L (ref 0–44)
AST: 20 U/L (ref 15–41)
Albumin: 3.6 g/dL (ref 3.5–5.0)
Alkaline Phosphatase: 73 U/L (ref 38–126)
Anion gap: 7 (ref 5–15)
BUN: 14 mg/dL (ref 6–20)
CO2: 28 mmol/L (ref 22–32)
Calcium: 8.9 mg/dL (ref 8.9–10.3)
Chloride: 104 mmol/L (ref 98–111)
Creatinine, Ser: 1.16 mg/dL — ABNORMAL HIGH (ref 0.44–1.00)
GFR calc Af Amer: >60 mL/min (ref >60)
GFR calc non Af Amer: >60 mL/min (ref >60)
GLUCOSE: 120 mg/dL — AB (ref 70–99)
Potassium: 3.8 mmol/L (ref 3.5–5.1)
Sodium: 139 mmol/L (ref 135–145)
TOTAL PROTEIN: 7.5 g/dL (ref 6.5–8.1)
Total Bilirubin: 0.2 mg/dL — ABNORMAL LOW (ref 0.3–1.2)

## 2018-08-04 LAB — PROTIME-INR
INR: 1.02
PROTHROMBIN TIME: 13.3 s (ref 11.4–15.2)

## 2018-08-04 LAB — CBC
HCT: 38.2 % (ref 36.0–46.0)
Hemoglobin: 11.8 g/dL — ABNORMAL LOW (ref 12.0–15.0)
MCH: 24.1 pg — ABNORMAL LOW (ref 26.0–34.0)
MCHC: 30.9 g/dL (ref 30.0–36.0)
MCV: 78.1 fL — ABNORMAL LOW (ref 80.0–100.0)
Platelets: 305 10*3/uL (ref 150–400)
RBC: 4.89 MIL/uL (ref 3.87–5.11)
RDW: 16 % — AB (ref 11.5–15.5)
WBC: 5.1 10*3/uL (ref 4.0–10.5)
nRBC: 0 % (ref 0.0–0.2)

## 2018-08-04 LAB — I-STAT CHEM 8, ED
BUN: 16 mg/dL (ref 6–20)
Calcium, Ion: 1.19 mmol/L (ref 1.15–1.40)
Chloride: 103 mmol/L (ref 98–111)
Creatinine, Ser: 1.1 mg/dL — ABNORMAL HIGH (ref 0.44–1.00)
Glucose, Bld: 114 mg/dL — ABNORMAL HIGH (ref 70–99)
HCT: 40 % (ref 36.0–46.0)
Hemoglobin: 13.6 g/dL (ref 12.0–15.0)
Potassium: 3.6 mmol/L (ref 3.5–5.1)
Sodium: 141 mmol/L (ref 135–145)
TCO2: 27 mmol/L (ref 22–32)

## 2018-08-04 LAB — I-STAT BETA HCG BLOOD, ED (MC, WL, AP ONLY): I-stat hCG, quantitative: <5 m[IU]/mL (ref <5)

## 2018-08-04 LAB — APTT: APTT: 29 s (ref 24–36)

## 2018-08-04 LAB — I-STAT TROPONIN, ED: Troponin i, poc: 0.01 ng/mL (ref 0.00–0.08)

## 2018-08-04 NOTE — ED Triage Notes (Signed)
Pt states that she has been having SOB all day, losing her voice, nonproductive cough, headache, L arm numbness that started this morning. Neuro intact bilaterally,

## 2018-08-04 NOTE — Discharge Instructions (Signed)
You may take over-the-counter medicine for symptomatic relief, such as Tylenol, Motrin, TheraFlu, Alka seltzer , black elderberry, etc. Please limit acetaminophen (Tylenol) to 4000 mg and Ibuprofen (Motrin, Advil, etc.) to 2400 mg for a 24hr period. Please note that other over-the-counter medicine may contain acetaminophen or ibuprofen as a component of their ingredients.   

## 2018-08-04 NOTE — ED Notes (Signed)
ED Provider at bedside. 

## 2018-08-04 NOTE — ED Provider Notes (Signed)
[facility] EMERGENCY DEPARTMENT Provider Note  CSN: [id number] Arrival date & time: 08/04/18 0135 Chief Complaint(s) Shortness of Breath and Numbness  HPI Felicia [facility] is a 33 y.o. female   The history is provided by the patient.  URI   This is a new problem. The current episode started 2 days ago. The problem has not changed since onset.There has been no fever. Associated symptoms include congestion, rhinorrhea, sore throat, swollen glands and cough. Pertinent negatives include no chest pain, no abdominal pain, no diarrhea, no nausea, no vomiting and no headaches. She has tried nothing for the symptoms.   Also reports LUE numbness while sleeping which resolves with position changes.  Past Medical History Past Medical History:  Diagnosis Date  . Diabetes mellitus without complication ([place])   . Fibroids   . Gestational diabetes   . Hypertension   . Mental disorder    bipolar   Patient Active Problem List   Diagnosis Date Noted  . Allergic rhinitis 05/04/2018  . Bipolar 1 disorder, depressed ([place]) 05/03/2018  . Status post cesarean section 08/08/2014  . Previous preterm delivery x 3 (33, 36 and 35 weeks), antepartum 06/18/2014  . Previous cesarean delivery x 4, antepartum 06/18/2014  . History of bipolar disorder 06/12/2014  . History of gestational diabetes 06/12/2014  . History of congenital or genetic condition 06/12/2014  . Limited prenatal care in third trimester   . 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 Gestational diabetes mellitus, antepartum    Home Medication(s) Prior to Admission medications   Medication Sig Start Date End Date Taking? Authorizing Provider  amLODipine (NORVASC) 10 MG tablet Take 1 tablet (10 mg total) by mouth daily. 05/07/18   Money, Lowry Ram, FNP  ARIPiprazole (ABILIFY) 10 MG tablet Take 1 tablet (10 mg total) by mouth daily. For mood control 05/07/18   Money, Darnelle Maffucci B, FNP  lisinopril  (PRINIVIL,ZESTRIL) 10 MG tablet Take 1 tablet (10 mg total) by mouth daily. 05/07/18   Money, Lowry Ram, FNP  mirtazapine (REMERON) 7.5 MG tablet Take 1 tablet (7.5 mg total) by mouth at bedtime. For mood control 05/06/18   Money, Lowry Ram, FNP                                                                                                                                    Past Surgical History Past Surgical History:  Procedure Laterality Date  . CESAREAN SECTION     x 4  . CESAREAN SECTION N/A 08/08/2014   Procedure: CESAREAN SECTION;  Surgeon: Truett Mainland, DO;  Location: [facility];  Service: Obstetrics;  Laterality: N/A;  repeat  . DILATION AND CURETTAGE OF UTERUS     Family History Family History  Problem Relation Age of Onset  . Cancer Mother     Social History Social History   Tobacco Use  . Smoking  status: Current Some Day Smoker    Packs/day: 0.50    Types: Cigarettes  . Smokeless tobacco: Never Used  . Tobacco comment: Pt declined  Substance Use Topics  . Alcohol use: Yes    Alcohol/week: 10.0 - 12.0 standard drinks    Types: 10 - 12 Cans of beer per week    Comment: Pt admits drinking daily  . Drug use: Yes    Types: Cocaine   Allergies Patient has no known allergies.  Review of Systems Review of Systems  HENT: Positive for congestion, rhinorrhea and sore throat.   Respiratory: Positive for cough.   Cardiovascular: Negative for chest pain.  Gastrointestinal: Negative for abdominal pain, diarrhea, nausea and vomiting.  Neurological: Negative for headaches.   All other systems are reviewed and are negative for acute change except as noted in the HPI  Physical Exam Vital Signs  I have reviewed the triage vital signs BP (!) 177/108 (BP Location: Left Arm)   Pulse (!) 102   Temp 97.9 F (36.6 C) (Oral)   Resp 14   SpO2 100%   Physical Exam Vitals signs reviewed.  Constitutional:      General: She is not in acute distress.    Appearance: She is  well-developed. She is not diaphoretic.  HENT:     Head: Normocephalic and atraumatic.     Nose: Mucosal edema and rhinorrhea present.     Mouth/Throat:     Comments: Post nasal drip Eyes:     General: No scleral icterus.       Right eye: No discharge.        Left eye: No discharge.     Conjunctiva/sclera: Conjunctivae normal.     Pupils: Pupils are equal, round, and reactive to light.  Neck:     Musculoskeletal: Normal range of motion and neck supple.  Cardiovascular:     Rate and Rhythm: Normal rate and regular rhythm.     Heart sounds: No murmur. No friction rub. No gallop.   Pulmonary:     Effort: Pulmonary effort is normal. No respiratory distress.     Breath sounds: Normal breath sounds. No stridor. No rales.  Abdominal:     General: There is no distension.     Palpations: Abdomen is soft.     Tenderness: There is no abdominal tenderness.  Musculoskeletal:        General: No tenderness.  Lymphadenopathy:     Cervical: Cervical adenopathy present.     Right cervical: Superficial cervical adenopathy present.     Left cervical: Superficial cervical adenopathy present.  Skin:    General: Skin is warm and dry.     Findings: No erythema or rash.  Neurological:     Mental Status: She is alert and oriented to person, place, and time.     ED Results and Treatments Labs (all labs ordered are listed, but only abnormal results are displayed) Labs Reviewed  CBC - Abnormal; Notable for the following components:      Result Value   Hemoglobin 11.8 (*)    MCV 78.1 (*)    MCH 24.1 (*)    RDW 16.0 (*)    All other components within normal limits  COMPREHENSIVE METABOLIC PANEL - Abnormal; Notable for the following components:   Glucose, Bld 120 (*)    Creatinine, Ser 1.16 (*)    Total Bilirubin 0.2 (*)    All other components within normal limits  I-STAT CHEM 8, ED - Abnormal; Notable for  the following components:   Creatinine, Ser 1.10 (*)    Glucose, Bld 114 (*)    All  other components within normal limits  PROTIME-INR  APTT  DIFFERENTIAL  I-STAT TROPONIN, ED  I-STAT BETA HCG BLOOD, ED ([facility], [facility], [facility] ONLY)  CBG MONITORING, ED                                                                                                                         EKG  EKG Interpretation  Date/Time:  Thursday August 04 2018 01:48:18 EST Ventricular Rate:  98 PR Interval:  136 QRS Duration: 82 QT Interval:  372 QTC Calculation: 474 R Axis:   55 Text Interpretation:  Normal sinus rhythm Normal ECG No significant change since last tracing Confirmed by Addison Lank [phone]) on 08/04/2018 3:28:58 AM      Radiology Dg Chest 2 View  Result Date: 08/04/2018 CLINICAL DATA:  Shortness of breath EXAM: CHEST - 2 VIEW COMPARISON:  06/20/2018 FINDINGS: The heart size and mediastinal contours are within normal limits. Both lungs are clear. The visualized skeletal structures are unremarkable. IMPRESSION: No active cardiopulmonary disease. Electronically Signed   By: Ulyses Jarred M.D.   On: 08/04/2018 02:20   Pertinent labs & imaging results that were available during my care of the patient were reviewed by me and considered in my medical decision making (see chart for details).  Medications Ordered in ED Medications - No data to display                                                                                                                                  Procedures Procedures  (including critical care time)  Medical Decision Making / ED Course I have reviewed the nursing notes for this encounter and the patient's prior records (if available in EHR or on provided paperwork).    33 y.o. female presents with cough, rhinorrhea, and nasal congestion for 2 days. adequate oral hydration. Rest of history as above.  Patient appears well. No signs of toxicity. No hypoxia, tachypnea or other signs of respiratory distress. No sign of clinical dehydration. Lung exam clear. Rest of  exam as above.  Labs reassuring without leukocytosis.  Chest x-ray without evidence of pneumonia.  Most consistent with viral upper respiratory infection.   No evidence suggestive of pharyngitis, AOM, PNA   Discussed symptomatic treatment with the patient and they will follow  closely with their PCP.      Final Clinical Impression(s) / ED Diagnoses Final diagnoses:  Acute nasopharyngitis  Lymphadenopathy   Disposition: Discharge  Condition: Good  I have discussed the results, Dx and Tx plan with the patient who expressed understanding and agree(s) with the plan. Discharge instructions discussed at great length. The patient was given strict return precautions who verbalized understanding of the instructions. No further questions at time of discharge.    ED Discharge Orders    None       Follow Up: Primary care provider  Schedule an appointment as soon as possible for a visit  If you do not have a primary care physician, contact [facility] at [phone] for referral      This chart was dictated using voice recognition software.  Despite best efforts to proofread,  errors can occur which can change the documentation meaning.   Fatima Blank, MD 08/04/18 [phone]

## 2018-08-04 NOTE — ED Notes (Signed)
Pt verbalizes understanding of d/c instructions. Pt ambulatory at d/c with all belongings.   

## 2018-08-21 ENCOUNTER — Emergency Department (HOSPITAL_COMMUNITY)
Admission: EM | Admit: 2018-08-21 | Discharge: 2018-08-22 | Disposition: A | Discharge status: Other Acute Inpatient Hospital | Payer: Medicare HMO | Attending: Emergency Medicine | Admitting: Emergency Medicine

## 2018-08-21 ENCOUNTER — Other Ambulatory Visit: Payer: Self-pay

## 2018-08-21 ENCOUNTER — Encounter (HOSPITAL_COMMUNITY): Payer: Self-pay | Admitting: Emergency Medicine

## 2018-08-21 DIAGNOSIS — F314 Bipolar disorder, current episode depressed, severe, without psychotic features: Secondary | ICD-10-CM | POA: Diagnosis not present

## 2018-08-21 DIAGNOSIS — Z79899 Other long term (current) drug therapy: Secondary | ICD-10-CM | POA: Insufficient documentation

## 2018-08-21 DIAGNOSIS — E119 Type 2 diabetes mellitus without complications: Secondary | ICD-10-CM | POA: Insufficient documentation

## 2018-08-21 DIAGNOSIS — F17210 Nicotine dependence, cigarettes, uncomplicated: Secondary | ICD-10-CM | POA: Diagnosis not present

## 2018-08-21 DIAGNOSIS — R45851 Suicidal ideations: Secondary | ICD-10-CM | POA: Insufficient documentation

## 2018-08-21 DIAGNOSIS — I10 Essential (primary) hypertension: Secondary | ICD-10-CM | POA: Diagnosis not present

## 2018-08-21 DIAGNOSIS — F1420 Cocaine dependence, uncomplicated: Secondary | ICD-10-CM | POA: Diagnosis not present

## 2018-08-21 DIAGNOSIS — F1910 Other psychoactive substance abuse, uncomplicated: Secondary | ICD-10-CM

## 2018-08-21 DIAGNOSIS — F99 Mental disorder, not otherwise specified: Secondary | ICD-10-CM | POA: Diagnosis present

## 2018-08-21 LAB — COMPREHENSIVE METABOLIC PANEL
ALT: 31 U/L (ref 0–44)
AST: 30 U/L (ref 15–41)
Albumin: 4.2 g/dL (ref 3.5–5.0)
Alkaline Phosphatase: 84 U/L (ref 38–126)
Anion gap: 13 (ref 5–15)
BUN: 9 mg/dL (ref 6–20)
CO2: 24 mmol/L (ref 22–32)
Calcium: 9.2 mg/dL (ref 8.9–10.3)
Chloride: 104 mmol/L (ref 98–111)
Creatinine, Ser: 1.26 mg/dL — ABNORMAL HIGH (ref 0.44–1.00)
GFR calc Af Amer: >60 mL/min (ref >60)
GFR calc non Af Amer: 55 mL/min — ABNORMAL LOW (ref >60)
Glucose, Bld: 83 mg/dL (ref 70–99)
Potassium: 3.5 mmol/L (ref 3.5–5.1)
Sodium: 141 mmol/L (ref 135–145)
Total Bilirubin: 0.9 mg/dL (ref 0.3–1.2)
Total Protein: 8.5 g/dL — ABNORMAL HIGH (ref 6.5–8.1)

## 2018-08-21 LAB — CBC
HCT: 39.5 % (ref 36.0–46.0)
Hemoglobin: 12 g/dL (ref 12.0–15.0)
MCH: 23.2 pg — AB (ref 26.0–34.0)
MCHC: 30.4 g/dL (ref 30.0–36.0)
MCV: 76.4 fL — ABNORMAL LOW (ref 80.0–100.0)
Platelets: 388 10*3/uL (ref 150–400)
RBC: 5.17 MIL/uL — ABNORMAL HIGH (ref 3.87–5.11)
RDW: 15.5 % (ref 11.5–15.5)
WBC: 10.4 10*3/uL (ref 4.0–10.5)
nRBC: 0 % (ref 0.0–0.2)

## 2018-08-21 LAB — RAPID URINE DRUG SCREEN, HOSP PERFORMED
Amphetamines: POSITIVE — AB
BARBITURATES: NOT DETECTED
Benzodiazepines: NOT DETECTED
Cocaine: POSITIVE — AB
Opiates: NOT DETECTED
Tetrahydrocannabinol: POSITIVE — AB

## 2018-08-21 LAB — ACETAMINOPHEN LEVEL: Acetaminophen (Tylenol), Serum: <10 ug/mL — ABNORMAL LOW (ref 10–30)

## 2018-08-21 LAB — I-STAT BETA HCG BLOOD, ED (MC, WL, AP ONLY): I-stat hCG, quantitative: <5 m[IU]/mL (ref <5)

## 2018-08-21 LAB — CBG MONITORING, ED: Glucose-Capillary: 69 mg/dL — ABNORMAL LOW (ref 70–99)

## 2018-08-21 LAB — SALICYLATE LEVEL: Salicylate Lvl: <7 mg/dL (ref 2.8–30.0)

## 2018-08-21 LAB — ETHANOL: Alcohol, Ethyl (B): <10 mg/dL (ref <10)

## 2018-08-21 MED ORDER — MIRTAZAPINE 7.5 MG PO TABS
7.5000 mg | ORAL_TABLET | Freq: Every day | ORAL | Status: DC
  Administered 2018-08-21: 7.5 mg via ORAL
  Filled 2018-08-21: qty 1

## 2018-08-21 MED ORDER — NICOTINE 21 MG/24HR TD PT24: 21.0000 mg | MEDICATED_PATCH | Freq: Every day | TRANSDERMAL | Status: DC | PRN

## 2018-08-21 MED ORDER — LISINOPRIL 10 MG PO TABS
10.0000 mg | ORAL_TABLET | Freq: Every day | ORAL | Status: DC
  Administered 2018-08-22: 10 mg via ORAL
  Filled 2018-08-21: qty 1

## 2018-08-21 MED ORDER — ALUM & MAG HYDROXIDE-SIMETH 200-200-20 MG/5ML PO SUSP: 30.0000 mL | Freq: Four times a day (QID) | ORAL | Status: DC | PRN

## 2018-08-21 MED ORDER — ONDANSETRON HCL 4 MG PO TABS: 4.0000 mg | ORAL_TABLET | Freq: Three times a day (TID) | ORAL | Status: DC | PRN

## 2018-08-21 MED ORDER — SODIUM CHLORIDE 0.9 % IV BOLUS
1000.0000 mL | Freq: Once | INTRAVENOUS | Status: AC
  Administered 2018-08-21: 1000 mL via INTRAVENOUS

## 2018-08-21 MED ORDER — ZOLPIDEM TARTRATE 5 MG PO TABS
5.0000 mg | ORAL_TABLET | Freq: Every evening | ORAL | Status: DC | PRN
  Administered 2018-08-21: 5 mg via ORAL
  Filled 2018-08-21: qty 1

## 2018-08-21 MED ORDER — AMLODIPINE BESYLATE 5 MG PO TABS
10.0000 mg | ORAL_TABLET | Freq: Every day | ORAL | Status: DC
  Administered 2018-08-22: 10 mg via ORAL
  Filled 2018-08-21: qty 2

## 2018-08-21 MED ORDER — IBUPROFEN 400 MG PO TABS: 600.0000 mg | ORAL_TABLET | Freq: Three times a day (TID) | ORAL | Status: DC | PRN

## 2018-08-21 MED ORDER — ARIPIPRAZOLE 10 MG PO TABS
10.0000 mg | ORAL_TABLET | Freq: Every day | ORAL | Status: DC
  Administered 2018-08-22: 10 mg via ORAL
  Filled 2018-08-21: qty 1

## 2018-08-21 NOTE — ED Notes (Signed)
Pt being placed on hospital bed for comfort.

## 2018-08-21 NOTE — ED Notes (Signed)
Meal tray ordered for pt  

## 2018-08-21 NOTE — ED Notes (Signed)
Patient wanded by security. Belongings with security. Clothes in [place] 3

## 2018-08-21 NOTE — ED Notes (Signed)
[facility] called, recommended inpatient, may have a bed in the morning
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
## 2018-08-21 NOTE — ED Provider Notes (Signed)
[facility] EMERGENCY DEPARTMENT Provider Note   CSN: [id number] Arrival date & time: 08/21/18  1514     History   Chief Complaint Chief Complaint  Patient presents with  . Suicidal    HPI Felicia Foster is a 33 y.o. female past medical history of diabetes, hypertension, bipolar disorder.  He has previous admissions for suicide attempt.  Patient states that she feels hopeless, depression alone.  She just wants to be with her mother who is deceased.  She has 4 kids at home that range in ages from 66 to 41 years old.  States that she has been drinking and using "any drugs I can get my hands on."  She denies dependency on any of them.  She has not been taking her regular home medications.  Patient arrives he had [employer] voluntarily.  She denies homicidal ideation.  HPI  Past Medical History:  Diagnosis Date  . Diabetes mellitus without complication ([place])   . Fibroids   . Gestational diabetes   . Hypertension   . Mental disorder    bipolar    Patient Active Problem List   Diagnosis Date Noted  . Allergic rhinitis 05/04/2018  . Bipolar 1 disorder, depressed ([place]) 05/03/2018  . Status post cesarean section 08/08/2014  . Previous preterm delivery x 3 (33, 36 and 35 weeks), antepartum 06/18/2014  . Previous cesarean delivery x 4, antepartum 06/18/2014  . History of bipolar disorder 06/12/2014  . History of gestational diabetes 06/12/2014  . History of congenital or genetic condition 06/12/2014  . Limited prenatal care in third trimester   . 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 Gestational diabetes mellitus, antepartum     Past Surgical History:  Procedure Laterality Date  . CESAREAN SECTION     x 4  . CESAREAN SECTION N/A 08/08/2014   Procedure: CESAREAN SECTION;  Surgeon: Truett Mainland, DO;  Location: [facility];  Service: Obstetrics;  Laterality: N/A;  repeat  . DILATION AND CURETTAGE OF UTERUS       OB History    Gravida  8   Para  5   Term  2   Preterm  3   AB  3   Living  5     SAB  1   TAB  2   Ectopic  0   Multiple  0   Live Births  5            Home Medications    Prior to Admission medications   Medication Sig Start Date End Date Taking? Authorizing Provider  amLODipine (NORVASC) 10 MG tablet Take 1 tablet (10 mg total) by mouth daily. 05/07/18   Money, Lowry Ram, FNP  ARIPiprazole (ABILIFY) 10 MG tablet Take 1 tablet (10 mg total) by mouth daily. For mood control 05/07/18   Money, Darnelle Maffucci B, FNP  lisinopril (PRINIVIL,ZESTRIL) 10 MG tablet Take 1 tablet (10 mg total) by mouth daily. 05/07/18   Money, Lowry Ram, FNP  mirtazapine (REMERON) 7.5 MG tablet Take 1 tablet (7.5 mg total) by mouth at bedtime. For mood control 05/06/18   Money, Lowry Ram, FNP    Family History Family History  Problem Relation Age of Onset  . Cancer Mother     Social History Social History   Tobacco Use  . Smoking status: Current Some Day Smoker    Packs/day: 0.50    Types: Cigarettes  . Smokeless tobacco: Never Used  .  Tobacco comment: Pt declined  Substance Use Topics  . Alcohol use: Yes    Alcohol/week: 10.0 - 12.0 standard drinks    Types: 10 - 12 Cans of beer per week    Comment: Pt admits drinking daily  . Drug use: Yes    Types: Cocaine     Allergies   Patient has no known allergies.   Review of Systems Review of Systems  Ten systems reviewed and are negative for acute change, except as noted in the HPI.    Physical Exam Updated Vital Signs BP (!) 177/115   Pulse (!) 121   Temp 97.9 F (36.6 C) (Oral)   Resp 20   Ht 5\' 6"  (1.676 m)   Wt 127 kg   LMP 08/10/2018 (Approximate)   SpO2 98%   BMI 45.19 kg/m   Physical Exam Vitals signs and nursing note reviewed.  Constitutional:      General: She is not in acute distress.    Appearance: She is well-developed. She is not diaphoretic.  HENT:     Head: Normocephalic and atraumatic.  Eyes:     General: No scleral  icterus.    Conjunctiva/sclera: Conjunctivae normal.  Neck:     Musculoskeletal: Normal range of motion.  Cardiovascular:     Rate and Rhythm: Normal rate and regular rhythm.     Heart sounds: Normal heart sounds. No murmur. No friction rub. No gallop.   Pulmonary:     Effort: Pulmonary effort is normal. No respiratory distress.     Breath sounds: Normal breath sounds.  Abdominal:     General: Bowel sounds are normal. There is no distension.     Palpations: Abdomen is soft. There is no mass.     Tenderness: There is no abdominal tenderness. There is no guarding.  Skin:    General: Skin is warm and dry.  Neurological:     Mental Status: She is alert and oriented to person, place, and time.  Psychiatric:        Behavior: Behavior normal.      ED Treatments / Results  Labs (all labs ordered are listed, but only abnormal results are displayed) Labs Reviewed  COMPREHENSIVE METABOLIC PANEL  ETHANOL  SALICYLATE LEVEL  ACETAMINOPHEN LEVEL  CBC  RAPID URINE DRUG SCREEN, HOSP PERFORMED  I-STAT BETA HCG BLOOD, ED ([facility], [facility], [facility] ONLY)    EKG None  Radiology No results found.  Procedures Procedures (including critical care time)  Medications Ordered in ED Medications  sodium chloride 0.9 % bolus 1,000 mL (has no administration in time range)     Initial Impression / Assessment and Plan / ED Course  I have reviewed the triage vital signs and the nursing notes.  Pertinent labs & imaging results that were available during my care of the patient were reviewed by me and considered in my medical decision making (see chart for details).     33 year old female with polysubstance abuse, major depressive disorder and suicidal ideation.  She is medically clear and appears appropriate for psychiatric evaluation.  Final Clinical Impressions(s) / ED Diagnoses   Final diagnoses:  None    ED Discharge Orders    None       Margarita Mail, PA-C 08/22/18 0014    Duffy Bruce, MD 08/22/18 1109

## 2018-08-21 NOTE — ED Notes (Signed)
Patient aware of need for urine sample ?

## 2018-08-21 NOTE — ED Notes (Signed)
EDP at bedside  

## 2018-08-21 NOTE — ED Triage Notes (Signed)
Patient arrived voluntarily in police custody. Reports being depressed and having suicidal thoughts for the since December. Last suicide attempt in December, in which she took sleeping pills. Was seen at [facility] after signing a safety plan. Patient presents tearful and police was called by ex.

## 2018-08-21 NOTE — ED Notes (Signed)
Pt at nurses station making a phone call. Required multiple attempts to reach the intended recipient, pt began to get notably agitated and upset at the conversation. Conversation began @2042  and ended @2047 .   Pt returned to bed, now resting and talking to the sitter.

## 2018-08-22 ENCOUNTER — Inpatient Hospital Stay (HOSPITAL_COMMUNITY)
Admission: AD | Admit: 2018-08-22 | Discharge: 2018-08-26 | DRG: 885 | Disposition: A | Discharge status: Home / Self Care | Payer: Medicare HMO | Source: Intra-hospital | Attending: Psychiatry | Admitting: Psychiatry

## 2018-08-22 ENCOUNTER — Other Ambulatory Visit: Payer: Self-pay

## 2018-08-22 ENCOUNTER — Encounter (HOSPITAL_COMMUNITY): Payer: Self-pay | Admitting: *Deleted

## 2018-08-22 DIAGNOSIS — I10 Essential (primary) hypertension: Secondary | ICD-10-CM | POA: Diagnosis present

## 2018-08-22 DIAGNOSIS — G4700 Insomnia, unspecified: Secondary | ICD-10-CM | POA: Diagnosis present

## 2018-08-22 DIAGNOSIS — F17210 Nicotine dependence, cigarettes, uncomplicated: Secondary | ICD-10-CM | POA: Diagnosis present

## 2018-08-22 DIAGNOSIS — Z79899 Other long term (current) drug therapy: Secondary | ICD-10-CM | POA: Diagnosis not present

## 2018-08-22 DIAGNOSIS — E119 Type 2 diabetes mellitus without complications: Secondary | ICD-10-CM | POA: Diagnosis present

## 2018-08-22 DIAGNOSIS — R45851 Suicidal ideations: Secondary | ICD-10-CM | POA: Diagnosis present

## 2018-08-22 DIAGNOSIS — F1010 Alcohol abuse, uncomplicated: Secondary | ICD-10-CM | POA: Diagnosis present

## 2018-08-22 DIAGNOSIS — Z638 Other specified problems related to primary support group: Secondary | ICD-10-CM

## 2018-08-22 DIAGNOSIS — F4310 Post-traumatic stress disorder, unspecified: Secondary | ICD-10-CM | POA: Diagnosis present

## 2018-08-22 DIAGNOSIS — R440 Auditory hallucinations: Secondary | ICD-10-CM | POA: Diagnosis present

## 2018-08-22 DIAGNOSIS — F314 Bipolar disorder, current episode depressed, severe, without psychotic features: Principal | ICD-10-CM | POA: Diagnosis present

## 2018-08-22 DIAGNOSIS — Z915 Personal history of self-harm: Secondary | ICD-10-CM

## 2018-08-22 DIAGNOSIS — F1424 Cocaine dependence with cocaine-induced mood disorder: Secondary | ICD-10-CM

## 2018-08-22 LAB — GLUCOSE, CAPILLARY: Glucose-Capillary: 140 mg/dL — ABNORMAL HIGH (ref 70–99)

## 2018-08-22 MED ORDER — MIRTAZAPINE 7.5 MG PO TABS
7.5000 mg | ORAL_TABLET | Freq: Every day | ORAL | Status: DC
  Administered 2018-08-22 – 2018-08-24 (×3): 7.5 mg via ORAL
  Filled 2018-08-22 (×5): qty 1

## 2018-08-22 MED ORDER — LISINOPRIL 10 MG PO TABS
10.0000 mg | ORAL_TABLET | Freq: Every day | ORAL | Status: DC
  Administered 2018-08-22 – 2018-08-26 (×4): 10 mg via ORAL
  Filled 2018-08-22 (×6): qty 1

## 2018-08-22 MED ORDER — IBUPROFEN 600 MG PO TABS
600.0000 mg | ORAL_TABLET | Freq: Three times a day (TID) | ORAL | Status: DC | PRN
  Administered 2018-08-22: 600 mg via ORAL
  Filled 2018-08-22: qty 1

## 2018-08-22 MED ORDER — HYDROXYZINE HCL 25 MG PO TABS: 25.0000 mg | ORAL_TABLET | Freq: Four times a day (QID) | ORAL | Status: AC | PRN

## 2018-08-22 MED ORDER — ALUM & MAG HYDROXIDE-SIMETH 200-200-20 MG/5ML PO SUSP: 30.0000 mL | ORAL | Status: DC | PRN

## 2018-08-22 MED ORDER — LOPERAMIDE HCL 2 MG PO CAPS: 2.0000 mg | ORAL_CAPSULE | ORAL | Status: AC | PRN

## 2018-08-22 MED ORDER — AMLODIPINE BESYLATE 10 MG PO TABS
10.0000 mg | ORAL_TABLET | Freq: Every day | ORAL | Status: DC
  Administered 2018-08-22 – 2018-08-26 (×4): 10 mg via ORAL
  Filled 2018-08-22 (×7): qty 1

## 2018-08-22 MED ORDER — NICOTINE 21 MG/24HR TD PT24: 21.0000 mg | MEDICATED_PATCH | Freq: Every day | TRANSDERMAL | Status: DC | PRN

## 2018-08-22 MED ORDER — MAGNESIUM HYDROXIDE 400 MG/5ML PO SUSP: 30.0000 mL | Freq: Every day | ORAL | Status: DC | PRN

## 2018-08-22 MED ORDER — ONDANSETRON HCL 4 MG PO TABS: 4.0000 mg | ORAL_TABLET | Freq: Three times a day (TID) | ORAL | Status: DC | PRN

## 2018-08-22 MED ORDER — ACETAMINOPHEN 325 MG PO TABS: 650.0000 mg | ORAL_TABLET | Freq: Four times a day (QID) | ORAL | Status: DC | PRN

## 2018-08-22 MED ORDER — LORAZEPAM 1 MG PO TABS: 1.0000 mg | ORAL_TABLET | Freq: Four times a day (QID) | ORAL | Status: AC | PRN

## 2018-08-22 MED ORDER — ARIPIPRAZOLE 10 MG PO TABS
10.0000 mg | ORAL_TABLET | Freq: Every day | ORAL | Status: DC
  Administered 2018-08-22: 10 mg via ORAL
  Filled 2018-08-22 (×4): qty 1

## 2018-08-22 MED ORDER — ADULT MULTIVITAMIN W/MINERALS CH
1.0000 | ORAL_TABLET | Freq: Every day | ORAL | Status: DC
  Administered 2018-08-22 – 2018-08-26 (×4): 1 via ORAL
  Filled 2018-08-22 (×6): qty 1

## 2018-08-22 MED ORDER — VITAMIN B-1 100 MG PO TABS
100.0000 mg | ORAL_TABLET | Freq: Every day | ORAL | Status: DC
  Administered 2018-08-24 – 2018-08-26 (×2): 100 mg via ORAL
  Filled 2018-08-22 (×5): qty 1

## 2018-08-22 NOTE — [facility] Group Notes (Signed)
LCSW Group Therapy Note 08/22/2018 3:15 PM  Type of Therapy and Topic: Group Therapy: Overcoming Obstacles  Participation Level: Did Not Attend  Description of Group:  In this group patients will be encouraged to explore what they see as obstacles to their own wellness and recovery. They will be guided to discuss their thoughts, feelings, and behaviors related to these obstacles. The group will process together ways to cope with barriers, with attention given to specific choices patients can make. Each patient will be challenged to identify changes they are motivated to make in order to overcome their obstacles. This group will be process-oriented, with patients participating in exploration of their own experiences as well as giving and receiving support and challenge from other group members.  Therapeutic Goals: 1. Patient will identify personal and current obstacles as they relate to admission. 2. Patient will identify barriers that currently interfere with their wellness or overcoming obstacles.  3. Patient will identify feelings, thought process and behaviors related to these barriers. 4. Patient will identify two changes they are willing to make to overcome these obstacles:   Summary of Patient Progress  Invited, chose not to attend.    Therapeutic Modalities:  Cognitive Behavioral Therapy Solution Focused Therapy Motivational Interviewing Relapse Prevention Therapy   Theresa Duty Clinical Social Worker

## 2018-08-22 NOTE — Tx Team (Signed)
Initial Treatment Plan 08/22/2018 12:17 PM Felicia Foster [id number]    PATIENT STRESSORS: Marital or family conflict Medication change or noncompliance Substance abuse   PATIENT STRENGTHS: Ability for insight Average or above average intelligence Capable of independent living General fund of knowledge Motivation for treatment/growth   PATIENT IDENTIFIED PROBLEMS: Depression Substance Abuse Suicidal thoughts "Just help me however you can"                     DISCHARGE CRITERIA:  Ability to meet basic life and health needs Improved stabilization in mood, thinking, and/or behavior Reduction of life-threatening or endangering symptoms to within safe limits Verbal commitment to aftercare and medication compliance  PRELIMINARY DISCHARGE PLAN: Attend aftercare/continuing care group Return to previous living arrangement  PATIENT/FAMILY INVOLVEMENT: This treatment plan has been presented to and reviewed with the patient, Felicia Foster, and/or family member, .  The patient and family have been given the opportunity to ask questions and make suggestions.  [place], [place], [place] 08/22/2018, 12:17 PM

## 2018-08-22 NOTE — [facility] Group Notes (Signed)
[facility] Group Notes:  (Nursing/MHT/Case Management/Adjunct)  Date:  08/22/2018  Time:  4:00 pm  Type of Therapy:  Psychoeducational Skills  Participation Level:  Did Not Attend  Participation Quality:    Affect:    Cognitive:    Insight:    Engagement in Group:    Modes of Intervention:    Summary of Progress/Problems:  Cammy Copa 08/22/2018, 6:51 PM

## 2018-08-22 NOTE — Progress Notes (Signed)
After lunch, patient has been sleeping, snoring.

## 2018-08-22 NOTE — Progress Notes (Signed)
D: Pt passive SI-contracts for safety. denies HI/AV. Pt stated she felt the "same " as when she came in, pt avoidant with writer this evening.   A: Pt was offered support and encouragement. Pt was given scheduled medications. Pt was encourage to attend groups. Q 15 minute checks were done for safety.   R:Pt attends groups and interacts well with peers and staff. Pt is taking medication. Pt has no complaints.Pt receptive to treatment and safety maintained on unit.   Problem: Activity: Goal: Sleeping patterns will improve Outcome: Progressing   Problem: Coping: Goal: Ability to demonstrate self-control will improve Outcome: Progressing

## 2018-08-22 NOTE — Progress Notes (Signed)
Patient did not attend wrap up group. 

## 2018-08-22 NOTE — ED Notes (Signed)
Pt noted to be sleeping soundly, audible snoring. Sitter remains at bedside.
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
## 2018-08-22 NOTE — Progress Notes (Signed)
Pt accepted to [facility], Bed 404-1  Marvia Pickles, NP is the accepting provider.  Dr. Myles Lipps, MD is the attending provider.  Call report to [facility] ED notified.   Pt is Voluntary.  Pt may be transported by [facility]  Pt scheduled  to arrive at [facility], as soon as transport can be arranged.  Areatha Keas. Judi Cong, MSW, [facility] Clinical Social Work [phone] (cell) [phone] (office)

## 2018-08-23 LAB — HEMOGLOBIN A1C
Hgb A1c MFr Bld: 6.1 % — ABNORMAL HIGH (ref 4.8–5.6)
MEAN PLASMA GLUCOSE: 128.37 mg/dL

## 2018-08-23 MED ORDER — ARIPIPRAZOLE 15 MG PO TABS
15.0000 mg | ORAL_TABLET | Freq: Every day | ORAL | Status: DC
  Administered 2018-08-24: 15 mg via ORAL
  Filled 2018-08-23 (×4): qty 1

## 2018-08-23 NOTE — BHH Group Notes (Signed)
LCSW Group Therapy Note   08/23/2018  2:30 PM   Type of Therapy and Topic:  Group Therapy- Anger: Consequences and Cues   Participation Level:  Invited, did not attend.     Description of Group:   In this group, patients defined and differentiated anger from other emotions and identified when and how anger becomes a problem. Patient's identified triggering events and their personal anger cues, secondary emotions to anger, and identified healthy coping mechanisms.    Therapeutic Goals: 1. Patients will identify and discuss consequences of anger and outbursts. 2. Patients will identify and discuss triggering events for anger. 3. Patients will explore other emotions that tend to fuel their secondary emotion of anger. 4. Patients will recognize their physical, behavioral, emotional, and cognitive anger cues. 5. Patients will identify coping skills for anger triggering emotions or events.    Summary of Patient Progress:   N/A  Therapeutic Modalities:   Cognitive Behavioral Therapy Motivation Interviewing Solution Focused Therapy  

## 2018-08-23 NOTE — Progress Notes (Signed)
Progress note  Pt was complaining of a fever. This writer tried to obtain vitals in the hopes the patient was afebrile and to provide her morning meds that the patient was noncompliant with. Pt instructed this writer to leave and asked "how many times are you all going to bother me today? I just laid down". Pt was allowed to rest. Pt was witnessed to be irritable with other staff throughout the day as well. Pt was irritable on the phone. Pt safe on the unit. q10m safety checks implemented and continued. Will continue to monitor.

## 2018-08-23 NOTE — Progress Notes (Signed)
[facility] MD Progress Note  08/23/2018 5:31 PM Felicia Foster  MRN:  [id number] Subjective: Patient reports feeling "tired", attributes to poor/fair sleep partly due to environmental noise and "nurses coming into my room at night". Does not endorse medication side effects.  Denies suicidal ideations.  Objective: Have discussed case with treatment team and have met with patient. 33 year old female, known to our unit from a prior admission in October 2019 at which time presented for depression and OTC overdose.  Presented to the hospital due to depression, passive SI, neurovegetative symptoms of depression.  Presents dysphoric, irritable.  Reports abusing cocaine and alcohol regularly but not daily, states she has been off psychiatric medications for several weeks.  Patient presents irritable, dysphoric, with limited eye contact.  Ruminates about her boyfriend leaving her. Denies suicidal ideations. Limited group/milieu participation at this time, spends most time in room. No current psychomotor restlessness or agitation, no tremors or diaphoresis, reports feeling "sore" but does not endorse muscular stiffness and there is no cogwheeling or rigidity noted. Denies medication side effects.  1/21 labs  HgbA1c 6.1, Serum glucose 128.  Principal Problem: Bipolar Disorder by history/ depression Diagnosis: Active Problems:   Bipolar 1 disorder, depressed, severe ([place])  Total Time spent with patient: 20 minutes  Past Psychiatric History:   Past Medical History:  Past Medical History:  Diagnosis Date  . Diabetes mellitus without complication ([place])   . Fibroids   . Gestational diabetes   . Hypertension   . Mental disorder    bipolar    Past Surgical History:  Procedure Laterality Date  . CESAREAN SECTION     x 4  . CESAREAN SECTION N/A 08/08/2014   Procedure: CESAREAN SECTION;  Surgeon: Truett Mainland, DO;  Location: [facility];  Service: Obstetrics;  Laterality: N/A;  repeat  . DILATION AND  CURETTAGE OF UTERUS     Family History:  Family History  Problem Relation Age of Onset  . Cancer Mother    Family Psychiatric  History:  Social History:  Social History   Substance and Sexual Activity  Alcohol Use Yes  . Alcohol/week: 10.0 - 12.0 standard drinks  . Types: 10 - 12 Cans of beer per week   Comment: Pt admits drinking daily     Social History   Substance and Sexual Activity  Drug Use Yes  . Types: Cocaine    Social History   Socioeconomic History  . Marital status: Single    Spouse name: Not on file  . Number of children: Not on file  . Years of education: Not on file  . Highest education level: Not on file  Occupational History  . Not on file  Social Needs  . Financial resource strain: Not on file  . Food insecurity:    Worry: Not on file    Inability: Not on file  . Transportation needs:    Medical: Not on file    Non-medical: Not on file  Tobacco Use  . Smoking status: Current Some Day Smoker    Packs/day: 0.50    Types: Cigarettes  . Smokeless tobacco: Never Used  . Tobacco comment: Pt declined  Substance and Sexual Activity  . Alcohol use: Yes    Alcohol/week: 10.0 - 12.0 standard drinks    Types: 10 - 12 Cans of beer per week    Comment: Pt admits drinking daily  . Drug use: Yes    Types: Cocaine  . Sexual activity: Yes    Birth control/protection:  None  Lifestyle  . Physical activity:    Days per week: Not on file    Minutes per session: Not on file  . Stress: Not on file  Relationships  . Social connections:    Talks on phone: Not on file    Gets together: Not on file    Attends religious service: Not on file    Active member of club or organization: Not on file    Attends meetings of clubs or organizations: Not on file    Relationship status: Not on file  Other Topics Concern  . Not on file  Social History Narrative   ** Merged History Encounter **       Additional Social History:   Sleep: Fair  Appetite:   Fair  Current Medications: Current Facility-Administered Medications  Medication Dose Route Frequency Provider Last Rate Last Dose  . acetaminophen (TYLENOL) tablet 650 mg  650 mg Oral Q6H PRN Lindon Romp A, NP      . alum & mag hydroxide-simeth (MAALOX/MYLANTA) 200-200-20 MG/5ML suspension 30 mL  30 mL Oral Q4H PRN Lindon Romp A, NP      . amLODipine (NORVASC) tablet 10 mg  10 mg Oral Daily Lindon Romp A, NP   10 mg at 08/22/18 1829  . ARIPiprazole (ABILIFY) tablet 10 mg  10 mg Oral Daily Lindon Romp A, NP   10 mg at 08/22/18 1829  . hydrOXYzine (ATARAX/VISTARIL) tablet 25 mg  25 mg Oral Q6H PRN , Myer Peer, MD      . ibuprofen (ADVIL,MOTRIN) tablet 600 mg  600 mg Oral Q8H PRN Lindon Romp A, NP   600 mg at 08/22/18 1215  . lisinopril (PRINIVIL,ZESTRIL) tablet 10 mg  10 mg Oral Daily Lindon Romp A, NP   10 mg at 08/22/18 1829  . loperamide (IMODIUM) capsule 2-4 mg  2-4 mg Oral PRN , Myer Peer, MD      . LORazepam (ATIVAN) tablet 1 mg  1 mg Oral Q6H PRN , Myer Peer, MD      . magnesium hydroxide (MILK OF MAGNESIA) suspension 30 mL  30 mL Oral Daily PRN Lindon Romp A, NP      . mirtazapine (REMERON) tablet 7.5 mg  7.5 mg Oral QHS Lindon Romp A, NP   7.5 mg at 08/22/18 2206  . multivitamin with minerals tablet 1 tablet  1 tablet Oral Daily , Myer Peer, MD   1 tablet at 08/22/18 1735  . nicotine (NICODERM CQ - dosed in mg/24 hours) patch 21 mg  21 mg Transdermal Daily PRN Rozetta Nunnery, NP      . ondansetron (ZOFRAN) tablet 4 mg  4 mg Oral Q8H PRN Lindon Romp A, NP      . thiamine (VITAMIN B-1) tablet 100 mg  100 mg Oral Daily , Myer Peer, MD        Lab Results:  Results for orders placed or performed during the hospital encounter of 08/22/18 (from the past 48 hour(s))  Glucose, capillary     Status: Abnormal   Collection Time: 08/22/18  2:04 PM  Result Value Ref Range   Glucose-Capillary 140 (H) 70 - 99 mg/dL  Hemoglobin A1c     Status: Abnormal    Collection Time: 08/23/18  6:28 AM  Result Value Ref Range   Hgb A1c MFr Bld 6.1 (H) 4.8 - 5.6 %    Comment: (NOTE) Pre diabetes:          5.7%-6.4% Diabetes:              >  6.4% Glycemic control for   <7.0% adults with diabetes    Mean Plasma Glucose 128.37 mg/dL    Comment: Performed at [facility] [address] [place], [place] [address]    Blood Alcohol level:  Lab Results  Component Value Date   [facility] <10 08/21/2018   ETH <10 84/69/6295    Metabolic Disorder Labs: Lab Results  Component Value Date   HGBA1C 6.1 (H) 08/23/2018   MPG 128.37 08/23/2018   MPG 131 05/04/2018   Lab Results  Component Value Date   PROLACTIN 13.9 05/04/2018   Lab Results  Component Value Date   CHOL 138 05/04/2018   TRIG 153 (H) 05/04/2018   HDL 38 (L) 05/04/2018   CHOLHDL 3.6 05/04/2018   VLDL 31 05/04/2018   LDLCALC 69 05/04/2018    Physical Findings: AIMS: Facial and Oral Movements Muscles of Facial Expression: None, normal Lips and Perioral Area: None, normal Jaw: None, normal Tongue: None, normal,Extremity Movements Upper (arms, wrists, hands, fingers): None, normal Lower (legs, knees, ankles, toes): None, normal, Trunk Movements Neck, shoulders, hips: None, normal, Overall Severity Severity of abnormal movements (highest score from questions above): None, normal Incapacitation due to abnormal movements: None, normal Patient's awareness of abnormal movements (rate only patient's report): No Awareness, Dental Status Current problems with teeth and/or dentures?: No Does patient usually wear dentures?: No  CIWA:  CIWA-Ar Total: 8 COWS:     Musculoskeletal: Strength & Muscle Tone: within normal limits no significant distal tremors, no diaphoresis, no psychomotor restlessness at this time Gait & Station: normal Patient leans: N/A  Psychiatric Specialty Exam: Physical Exam  ROS describes muscular aches/soreness, denies nausea or vomiting.   Blood pressure (!)  145/100, pulse 98, temperature 98 F (36.7 C), temperature source Oral, resp. rate 18, height '5\' 6"'  (1.676 m), weight 122.5 kg, last menstrual period 08/10/2018, SpO2 100 %.Body mass index is 43.58 kg/m.  General Appearance: Fairly Groomed  Eye Contact:  Fair  Speech:  Normal Rate  Volume:  Decreased  Mood:  Irritable, dysphoric  Affect:  Congruent  Thought Process:  Linear and Descriptions of Associations: Intact  Orientation:  Other:  Fully alert and attentive  Thought Content:  Does not endorse hallucinations and is not currently present internally preoccupied  Suicidal Thoughts:  No denies suicidal or self-injurious ideations at this time, denies homicidal ideations.  Homicidal Thoughts:  No  Memory:  Recent and remote grossly intact  Judgement:  Fair  Insight:  Fair  Psychomotor Activity:  Decreased  Concentration:  Concentration: Fair and Attention Span: Fair  Recall:  [employer] of Knowledge:  Fair  Language:  Fair  Akathisia:  Negative  Handed:  Right  AIMS (if indicated):     Assets:  Desire for Improvement Resilience  ADL's:  Intact  Cognition:  WNL  Sleep:  Number of Hours: 6.75   Assessment -  33 year old female, known to our unit from a prior admission in October 2019 at which time presented for depression and OTC overdose.  Presented to the hospital due to depression, passive SI, neurovegetative symptoms of depression.  Presents dysphoric, irritable.  Reports abusing cocaine and alcohol regularly but not daily, states she has been off psychiatric medications for several weeks.  Patient presents irritable, dysphoric, but denies any current suicidal ideations.  She remains ruminative about relationship stress/break-up.  Limited group/milieu interactions at this time, staying in her room most of the day. No current restlessness, diaphoresis, tremors, or overt symptoms of alcohol  withdrawal. Vitals 145/100, pulse 98. ( Has history of HTN, was restarted on  Norvasc/Lisinopril )   Tolerating Abilify, Remeron well thus far, which were medications prescribed at discharge during her prior psychiatric admission in October 2019.   Treatment Plan Summary: Daily contact with patient to assess and evaluate symptoms and progress in treatment, Medication management, Plan Inpatient treatment and Medications as below Encourage group and milieu participation to work on coping skills and symptom reduction Encourage efforts to work on sobriety and relapse prevention Increase Abilify to 15  mg daily for mood disorder Continue Remeron 7.5 mgrs QHS for mood disorder, insomnia Continue Norvasc, Lisinopril for HTN Continue Ativan PRNs for alcohol WDL if needed  Treatment team working on disposition [place], MD 08/23/2018, 5:31 PM

## 2018-08-23 NOTE — Progress Notes (Signed)
Patient did not attend wrap up group. 

## 2018-08-23 NOTE — Progress Notes (Signed)
CSW attempted to complete PSA with patient, patient sleeping soundly. CSW to follow up at a later time   Stephanie Acre, [place] Social Worker

## 2018-08-23 NOTE — Progress Notes (Signed)
Progress note  D: pt found in bed; non compliant with medication administration. Pt has no complaints at this time besides insomnia. Pt denies any physical pain or symptoms, rating her pain a 0/10. Pt has hypertension this morning. Pt has been labile most of the day. Pt denies any si/hi/ah/vh and verbally agrees to approach staff if these become apparent or before harming herself or others while at [facility]  A: pt provided support and encouragement. Pt given medication per protocol and standing orders. q75m safety checks implemented and continued.  R: pt safe on the unit. Will continue to monitor.

## 2018-08-23 NOTE — Progress Notes (Signed)
Nursing Progress Note: 7p-7a D: Pt currently presents with a ambivalent/depressed/irratable affect and behavior. Interacting minimally with the milieu. Pt reports good sleep during the previous night with current medication regimen. Pt did not attend wrap-up group.  A: Pt provided with medications per providers orders. Pt's labs and vitals were monitored throughout the night. Pt supported emotionally and encouraged to express concerns and questions. Pt educated on medications.  R: Pt's safety ensured with 15 minute and environmental checks. Pt currently denies SI, HI, and AVH. Pt verbally contracts to seek staff if SI,HI, or AVH occurs and to consult with staff before acting on any harmful thoughts. Will continue to monitor.

## 2018-08-24 NOTE — [facility] Suicide Risk Assessment (Signed)
[place] INPATIENT:  Family/Significant Other Suicide Prevention Education  Suicide Prevention Education:  Patient Refusal for Family/Significant Other Suicide Prevention Education: The patient Felicia Foster has refused to provide written consent for family/significant other to be provided Family/Significant Other Suicide Prevention Education during admission and/or prior to discharge.  Physician notified.  [name] 08/24/2018, 2:39 PM

## 2018-08-24 NOTE — Progress Notes (Signed)
[facility] MD Progress Note  08/24/2018 5:52 PM Felicia Foster  MRN:  [id number] Subjective: Patient states "I feel the same as I did when I came in".  Denies medication side effects.  Denies suicidal ideations at this time/contracts for safety on unit.  Objective: Have discussed case with treatment team and have met with patient. 33 year old female, known to our unit from a prior admission in October 2019 at which time presented for depression and OTC overdose.  Presented to the hospital due to depression, passive SI, neurovegetative symptoms of depression.  Presents dysphoric, irritable.  Reports abusing cocaine and alcohol regularly but not daily, states she has been off psychiatric medications for several weeks.  Currently patient presents with less irritability than yesterday, somewhat better related.  More conversant, fair eye contact.  Spoke about her significant other leaving her, and was ruminative about limited/poor social support system.  Presented future oriented and stated she might decide to return to [place], as has "nothing left" in [place]. Limited group/milieu participation, but has been out of bed more often today.  At this time denies suicidal ideations.  Staff reports patient remains labile, irritable, spending most time in her room. Labs- HgbA1C 6.1, Serum Glucose 128. BP improved , most recent 133/89, pulse 93   Principal Problem: Bipolar Disorder by history/ depression Diagnosis: Active Problems:   Bipolar 1 disorder, depressed, severe ([facility])  Total Time spent with patient: 20 minutes  Past Psychiatric History:   Past Medical History:  Past Medical History:  Diagnosis Date  . Diabetes mellitus without complication ([place])   . Fibroids   . Gestational diabetes   . Hypertension   . Mental disorder    bipolar    Past Surgical History:  Procedure Laterality Date  . CESAREAN SECTION     x 4  . CESAREAN SECTION N/A 08/08/2014   Procedure: CESAREAN SECTION;   Surgeon: Truett Mainland, DO;  Location: [facility];  Service: Obstetrics;  Laterality: N/A;  repeat  . DILATION AND CURETTAGE OF UTERUS     Family History:  Family History  Problem Relation Age of Onset  . Cancer Mother    Family Psychiatric  History:  Social History:  Social History   Substance and Sexual Activity  Alcohol Use Yes  . Alcohol/week: 10.0 - 12.0 standard drinks  . Types: 10 - 12 Cans of beer per week   Comment: Pt admits drinking daily     Social History   Substance and Sexual Activity  Drug Use Yes  . Types: Cocaine    Social History   Socioeconomic History  . Marital status: Single    Spouse name: Not on file  . Number of children: Not on file  . Years of education: Not on file  . Highest education level: Not on file  Occupational History  . Not on file  Social Needs  . Financial resource strain: Not on file  . Food insecurity:    Worry: Not on file    Inability: Not on file  . Transportation needs:    Medical: Not on file    Non-medical: Not on file  Tobacco Use  . Smoking status: Current Some Day Smoker    Packs/day: 0.50    Types: Cigarettes  . Smokeless tobacco: Never Used  . Tobacco comment: Pt declined  Substance and Sexual Activity  . Alcohol use: Yes    Alcohol/week: 10.0 - 12.0 standard drinks    Types: 10 - 12 Cans of beer  per week    Comment: Pt admits drinking daily  . Drug use: Yes    Types: Cocaine  . Sexual activity: Yes    Birth control/protection: None  Lifestyle  . Physical activity:    Days per week: Not on file    Minutes per session: Not on file  . Stress: Not on file  Relationships  . Social connections:    Talks on phone: Not on file    Gets together: Not on file    Attends religious service: Not on file    Active member of club or organization: Not on file    Attends meetings of clubs or organizations: Not on file    Relationship status: Not on file  Other Topics Concern  . Not on file  Social History  Narrative   ** Merged History Encounter **       Additional Social History:   Sleep: Fair  Appetite:  Fair  Current Medications: Current Facility-Administered Medications  Medication Dose Route Frequency Provider Last Rate Last Dose  . acetaminophen (TYLENOL) tablet 650 mg  650 mg Oral Q6H PRN Lindon Romp A, NP      . alum & mag hydroxide-simeth (MAALOX/MYLANTA) 200-200-20 MG/5ML suspension 30 mL  30 mL Oral Q4H PRN Lindon Romp A, NP      . amLODipine (NORVASC) tablet 10 mg  10 mg Oral Daily Lindon Romp A, NP   10 mg at 08/24/18 1033  . ARIPiprazole (ABILIFY) tablet 15 mg  15 mg Oral Daily Cobos, Fernando A, MD   15 mg at 08/24/18 1033  . hydrOXYzine (ATARAX/VISTARIL) tablet 25 mg  25 mg Oral Q6H PRN Cobos, Myer Peer, MD      . ibuprofen (ADVIL,MOTRIN) tablet 600 mg  600 mg Oral Q8H PRN Lindon Romp A, NP   600 mg at 08/22/18 1215  . lisinopril (PRINIVIL,ZESTRIL) tablet 10 mg  10 mg Oral Daily Lindon Romp A, NP   10 mg at 08/24/18 1034  . loperamide (IMODIUM) capsule 2-4 mg  2-4 mg Oral PRN Cobos, Myer Peer, MD      . LORazepam (ATIVAN) tablet 1 mg  1 mg Oral Q6H PRN Cobos, Myer Peer, MD      . magnesium hydroxide (MILK OF MAGNESIA) suspension 30 mL  30 mL Oral Daily PRN Lindon Romp A, NP      . mirtazapine (REMERON) tablet 7.5 mg  7.5 mg Oral QHS Lindon Romp A, NP   7.5 mg at 08/23/18 2215  . multivitamin with minerals tablet 1 tablet  1 tablet Oral Daily Cobos, Myer Peer, MD   1 tablet at 08/24/18 1034  . nicotine (NICODERM CQ - dosed in mg/24 hours) patch 21 mg  21 mg Transdermal Daily PRN Lindon Romp A, NP      . ondansetron (ZOFRAN) tablet 4 mg  4 mg Oral Q8H PRN Lindon Romp A, NP      . thiamine (VITAMIN B-1) tablet 100 mg  100 mg Oral Daily Cobos, Myer Peer, MD   100 mg at 08/24/18 1033    Lab Results:  Results for orders placed or performed during the hospital encounter of 08/22/18 (from the past 48 hour(s))  Hemoglobin A1c     Status: Abnormal   Collection Time:  08/23/18  6:28 AM  Result Value Ref Range   Hgb A1c MFr Bld 6.1 (H) 4.8 - 5.6 %    Comment: (NOTE) Pre diabetes:          5.7%-6.4% Diabetes:              >  6.4% Glycemic control for   <7.0% adults with diabetes    Mean Plasma Glucose 128.37 mg/dL    Comment: Performed at [facility] [address], [place], [place] [address]    Blood Alcohol level:  Lab Results  Component Value Date   [facility] <10 08/21/2018   ETH <10 82/50/5397    Metabolic Disorder Labs: Lab Results  Component Value Date   HGBA1C 6.1 (H) 08/23/2018   MPG 128.37 08/23/2018   MPG 131 05/04/2018   Lab Results  Component Value Date   PROLACTIN 13.9 05/04/2018   Lab Results  Component Value Date   CHOL 138 05/04/2018   TRIG 153 (H) 05/04/2018   HDL 38 (L) 05/04/2018   CHOLHDL 3.6 05/04/2018   VLDL 31 05/04/2018   LDLCALC 69 05/04/2018    Physical Findings: AIMS: Facial and Oral Movements Muscles of Facial Expression: None, normal Lips and Perioral Area: None, normal Jaw: None, normal Tongue: None, normal,Extremity Movements Upper (arms, wrists, hands, fingers): None, normal Lower (legs, knees, ankles, toes): None, normal, Trunk Movements Neck, shoulders, hips: None, normal, Overall Severity Severity of abnormal movements (highest score from questions above): None, normal Incapacitation due to abnormal movements: None, normal Patient's awareness of abnormal movements (rate only patient's report): No Awareness, Dental Status Current problems with teeth and/or dentures?: No Does patient usually wear dentures?: No  CIWA:  CIWA-Ar Total: 0 COWS:     Musculoskeletal: Strength & Muscle Tone: within normal limits no significant distal tremors, no diaphoresis, no psychomotor restlessness at this time Gait & Station: normal Patient leans: N/A  Psychiatric Specialty Exam: Physical Exam  ROS denies chest pain, no shortness of breath, no vomiting  Blood pressure 133/89, pulse 93, temperature 98 F  (36.7 C), temperature source Oral, resp. rate 18, height _0  (1.676 m), weight 122.5 kg, last menstrual period 08/10/2018, SpO2 100 %.Body mass index is 43.58 kg/m.  General Appearance: Fairly Groomed  Eye Contact:  Fair  Speech:  Normal Rate  Volume:  Decreased  Mood:  Still irritable/dysphoric but to a lesser degree than yesterday  Affect:  Congruent, briefly tearful when discussing her psychosocial stressors  Thought Process:  Linear and Descriptions of Associations: Intact  Orientation:  Other:  Fully alert and attentive  Thought Content:  Does not endorse hallucinations and is not currently present internally preoccupied  Suicidal Thoughts:  No denies suicidal or self-injurious ideations at this time, denies homicidal ideations.  Homicidal Thoughts:  No  Memory:  Recent and remote grossly intact  Judgement:  Fair  Insight:  Fair  Psychomotor Activity:  Decreased  Concentration:  Concentration: Fair and Attention Span: Fair  Recall:  [employer] of Knowledge:  Fair  Language:  Fair  Akathisia:  Negative  Handed:  Right  AIMS (if indicated):     Assets:  Desire for Improvement Resilience  ADL's:  Intact  Cognition:  WNL  Sleep:  Number of Hours: 6.25   Assessment -  33 year old female, known to our unit from a prior admission in October 2019 at which time presented for depression and OTC overdose.  Presented to the hospital due to depression, passive SI, neurovegetative symptoms of depression.  Presents dysphoric, irritable.  Reports abusing cocaine and alcohol regularly but not daily, states she has been off psychiatric medications for several weeks.  Patient remains dysphoric, irritable, ruminative, isolative in her room.  Today, however, presented more communicative and somewhat better related.  She is facing significant stressors, mainly broken relationship  and limited local support system.  She is denying medication side effects, compliance has been fair, states she will  take the medications.    Treatment Plan Summary: Daily contact with patient to assess and evaluate symptoms and progress in treatment, Medication management, Plan Inpatient treatment and Medications as below  And plan reviewed as below today 1/22 Encourage group and milieu participation to work on coping skills and symptom reduction Encourage efforts to work on sobriety and relapse prevention Continue  Abilify  15  mg daily for mood disorder Continue Remeron 7.5 mgrs QHS for mood disorder, insomnia Continue Norvasc, Lisinopril for HTN Continue Ativan PRNs for alcohol WDL if needed  Treatment team working on disposition [place], MD 08/24/2018, 5:52 PM   Patient ID: Frankey Poot, female   DOB: [birth year], 33 y.o.   MRN: [id number]

## 2018-08-24 NOTE — Tx Team (Signed)
Interdisciplinary Treatment and Diagnostic Plan Update  08/24/2018 Time of Session:  Felicia Foster MRN: [id number]  Principal Diagnosis: <principal problem not specified>  Secondary Diagnoses: Active Problems:   Bipolar 1 disorder, depressed, severe (HCC)   Current Medications:  Current Facility-Administered Medications  Medication Dose Route Frequency Provider Last Rate Last Dose  . acetaminophen (TYLENOL) tablet 650 mg  650 mg Oral Q6H PRN Lindon Romp A, NP      . alum & mag hydroxide-simeth (MAALOX/MYLANTA) 200-200-20 MG/5ML suspension 30 mL  30 mL Oral Q4H PRN Lindon Romp A, NP      . amLODipine (NORVASC) tablet 10 mg  10 mg Oral Daily Lindon Romp A, NP   10 mg at 08/24/18 1033  . ARIPiprazole (ABILIFY) tablet 15 mg  15 mg Oral Daily Cobos, Fernando A, MD   15 mg at 08/24/18 1033  . hydrOXYzine (ATARAX/VISTARIL) tablet 25 mg  25 mg Oral Q6H PRN Cobos, Myer Peer, MD      . ibuprofen (ADVIL,MOTRIN) tablet 600 mg  600 mg Oral Q8H PRN Lindon Romp A, NP   600 mg at 08/22/18 1215  . lisinopril (PRINIVIL,ZESTRIL) tablet 10 mg  10 mg Oral Daily Lindon Romp A, NP   10 mg at 08/24/18 1034  . loperamide (IMODIUM) capsule 2-4 mg  2-4 mg Oral PRN Cobos, Myer Peer, MD      . LORazepam (ATIVAN) tablet 1 mg  1 mg Oral Q6H PRN Cobos, Myer Peer, MD      . magnesium hydroxide (MILK OF MAGNESIA) suspension 30 mL  30 mL Oral Daily PRN Lindon Romp A, NP      . mirtazapine (REMERON) tablet 7.5 mg  7.5 mg Oral QHS Lindon Romp A, NP   7.5 mg at 08/23/18 2215  . multivitamin with minerals tablet 1 tablet  1 tablet Oral Daily Cobos, Myer Peer, MD   1 tablet at 08/24/18 1034  . nicotine (NICODERM CQ - dosed in mg/24 hours) patch 21 mg  21 mg Transdermal Daily PRN Lindon Romp A, NP      . ondansetron (ZOFRAN) tablet 4 mg  4 mg Oral Q8H PRN Lindon Romp A, NP      . thiamine (VITAMIN B-1) tablet 100 mg  100 mg Oral Daily Cobos, Myer Peer, MD   100 mg at 08/24/18 1033   PTA  Medications: Medications Prior to Admission  Medication Sig Dispense Refill Last Dose  . amLODipine (NORVASC) 10 MG tablet Take 1 tablet (10 mg total) by mouth daily. (Patient not taking: Reported on 08/21/2018) 30 tablet 0 Not Taking at Unknown time  . ARIPiprazole (ABILIFY) 10 MG tablet Take 1 tablet (10 mg total) by mouth daily. For mood control (Patient not taking: Reported on 08/21/2018) 30 tablet 0 Not Taking at Unknown time  . diphenhydrAMINE (BENADRYL) 25 MG tablet Take 25 mg by mouth at bedtime.   08/20/2018 at Unknown time  . diphenhydramine-acetaminophen (TYLENOL PM) 25-500 MG TABS tablet Take 1 tablet by mouth at bedtime.   08/20/2018 at Unknown time  . lisinopril (PRINIVIL,ZESTRIL) 10 MG tablet Take 1 tablet (10 mg total) by mouth daily. (Patient not taking: Reported on 08/21/2018) 30 tablet 0 Not Taking at Unknown time  . mirtazapine (REMERON) 7.5 MG tablet Take 1 tablet (7.5 mg total) by mouth at bedtime. For mood control (Patient not taking: Reported on 08/21/2018) 30 tablet 0 Not Taking at Unknown time    Patient Stressors: Marital or family conflict Medication change or noncompliance Substance abuse  Patient Strengths: Ability for insight Average or above average intelligence Capable of independent living General fund of knowledge Motivation for treatment/growth  Treatment Modalities: Medication Management, Group therapy, Case management,  1 to 1 session with clinician, Psychoeducation, Recreational therapy.   Physician Treatment Plan for Primary Diagnosis: <principal problem not specified> Long Term Goal(s): Improvement in symptoms so as ready for discharge Improvement in symptoms so as ready for discharge   Short Term Goals: Ability to identify changes in lifestyle to reduce recurrence of condition will improve Ability to maintain clinical measurements within normal limits will improve Ability to identify changes in lifestyle to reduce recurrence of condition will  improve Ability to maintain clinical measurements within normal limits will improve Ability to identify triggers associated with substance abuse/mental health issues will improve  Medication Management: Evaluate patient's response, side effects, and tolerance of medication regimen.  Therapeutic Interventions: 1 to 1 sessions, Unit Group sessions and Medication administration.  Evaluation of Outcomes: Not Met  Physician Treatment Plan for Secondary Diagnosis: Active Problems:   Bipolar 1 disorder, depressed, severe ([place])  Long Term Goal(s): Improvement in symptoms so as ready for discharge Improvement in symptoms so as ready for discharge   Short Term Goals: Ability to identify changes in lifestyle to reduce recurrence of condition will improve Ability to maintain clinical measurements within normal limits will improve Ability to identify changes in lifestyle to reduce recurrence of condition will improve Ability to maintain clinical measurements within normal limits will improve Ability to identify triggers associated with substance abuse/mental health issues will improve     Medication Management: Evaluate patient's response, side effects, and tolerance of medication regimen.  Therapeutic Interventions: 1 to 1 sessions, Unit Group sessions and Medication administration.  Evaluation of Outcomes: Not Met   RN Treatment Plan for Primary Diagnosis: <principal problem not specified> Long Term Goal(s): Knowledge of disease and therapeutic regimen to maintain health will improve  Short Term Goals: Ability to participate in decision making will improve, Ability to verbalize feelings will improve, Ability to disclose and discuss suicidal ideas, Ability to identify and develop effective coping behaviors will improve and Compliance with prescribed medications will improve  Medication Management: RN will administer medications as ordered by provider, will assess and evaluate patient's response  and provide education to patient for prescribed medication. RN will report any adverse and/or side effects to prescribing provider.  Therapeutic Interventions: 1 on 1 counseling sessions, Psychoeducation, Medication administration, Evaluate responses to treatment, Monitor vital signs and CBGs as ordered, Perform/monitor CIWA, COWS, AIMS and Fall Risk screenings as ordered, Perform wound care treatments as ordered.  Evaluation of Outcomes: Not Met   LCSW Treatment Plan for Primary Diagnosis: <principal problem not specified> Long Term Goal(s): Safe transition to appropriate next level of care at discharge, Engage patient in therapeutic group addressing interpersonal concerns.  Short Term Goals: Engage patient in aftercare planning with referrals and resources  Therapeutic Interventions: Assess for all discharge needs, 1 to 1 time with Social worker, Explore available resources and support systems, Assess for adequacy in community support network, Educate family and significant other(s) on suicide prevention, Complete Psychosocial Assessment, Interpersonal group therapy.  Evaluation of Outcomes: Not Met   Progress in Treatment: Attending groups: No. Participating in groups: No. Taking medication as prescribed: Yes. Toleration medication: Yes. Family/Significant other contact made: No, will contact:  if patient consents to collateral contacts Patient understands diagnosis: Yes. Discussing patient identified problems/goals with staff: Yes. Medical problems stabilized or resolved: Yes. Denies suicidal/homicidal ideation: Yes.  Issues/concerns per patient self-inventory: No. Other:   New problem(s) identified: None   New Short Term/Long Term Goal(s): medication stabilization, elimination of SI thoughts, development of comprehensive mental wellness plan.    Patient Goals:  Just help me however you can  Discharge Plan or Barriers: Patient was referred to the [facility] during her last  admission. CSW will continue to follow and assess for appropriate referrals and discharge planning.   Reason for Continuation of Hospitalization: Anxiety Depression Medication stabilization Suicidal ideation  Estimated Length of Stay: 08/29/2018  Attendees: Patient: 08/24/2018 10:57 AM  Physician: Dr. Neita Garnet, MD 08/24/2018 10:57 AM  Nursing: Benjamine Mola.Jenetta Downer RN 08/24/2018 10:57 AM  RN Care Manager: Lars Pinks, RN 08/24/2018 10:57 AM  Social Worker: Radonna Ricker, [place] 08/24/2018 10:57 AM  Recreational Therapist:  08/24/2018 10:57 AM  Other: Mable Paris, NP 08/24/2018 10:57 AM  Other:  08/24/2018 10:57 AM  Other: 08/24/2018 10:57 AM    Scribe for Treatment Team: Marylee Floras, [place] 08/24/2018 10:57 AM

## 2018-08-24 NOTE — Plan of Care (Signed)
Progress note  D: pt has been in bed all day from report. Pt noncompliant with medications. Pt had to be take her blood pressure medication because her BP was so elevated and she refused to come to the medication window. Pt is guarded in her assessment and labile. Pt denies any si/hi/ah/vh and verbally agrees to approach staff if these become apparent or before harming herself or others while at [facility]. A: pt provided support and encouragement. Pt given medication per protocol and standing orders. Q63m safety checks implemented and continued. R: pt safe on the unit. Will continue to monitor.   Pt not progressing in the following metrics  Problem: Education: Goal: Knowledge of [facility] Education information/materials will improve Outcome: Not Progressing Goal: Emotional status will improve Outcome: Not Progressing Goal: Mental status will improve Outcome: Not Progressing Goal: Verbalization of understanding the information provided will improve Outcome: Not Progressing
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
## 2018-08-24 NOTE — Progress Notes (Signed)
CSW attempted to complete PSA with patient, patient states she does not want to talk/engage in assessment. CSW will make another attempt after lunch.  Stephanie Acre, LCSW-A Clinical Social Worker

## 2018-08-25 MED ORDER — ARIPIPRAZOLE 10 MG PO TABS
10.0000 mg | ORAL_TABLET | Freq: Every day | ORAL | Status: DC
  Administered 2018-08-26: 10 mg via ORAL
  Filled 2018-08-25 (×3): qty 1

## 2018-08-25 MED ORDER — BUPROPION HCL ER (XL) 150 MG PO TB24
150.0000 mg | ORAL_TABLET | Freq: Every day | ORAL | Status: DC
  Administered 2018-08-26: 150 mg via ORAL
  Filled 2018-08-25 (×3): qty 1

## 2018-08-25 MED ORDER — TRAZODONE HCL 50 MG PO TABS
50.0000 mg | ORAL_TABLET | Freq: Every evening | ORAL | Status: DC | PRN
  Administered 2018-08-25: 50 mg via ORAL
  Filled 2018-08-25: qty 1

## 2018-08-25 MED ORDER — QUETIAPINE FUMARATE 25 MG PO TABS
25.0000 mg | ORAL_TABLET | Freq: Every day | ORAL | Status: DC
  Filled 2018-08-25: qty 1

## 2018-08-25 NOTE — Plan of Care (Signed)
  Problem: Education: Goal: Knowledge of [facility] Education information/materials will improve Outcome: Progressing   Problem: Activity: Goal: Sleeping patterns will improve Outcome: Progressing   Problem: Safety: Goal: Periods of time without injury will increase Outcome: Progressing

## 2018-08-25 NOTE — Plan of Care (Signed)
  Problem: Activity: Goal: Interest or engagement in activities will improve Outcome: Not Progressing   Problem: Health Behavior/Discharge Planning: Goal: Compliance with treatment plan for underlying cause of condition will improve Outcome: Progressing   Problem: Safety: Goal: Periods of time without injury will increase Outcome: Progressing  Dar Note: Patient presents with irritable affect and mood this morning.  Refused to get out of bed for medication and therapy after several attempts and encouragement.  Patient came out of her room this afternoon to request for her medications.  Presented with bright affect and mood with good eye contact.  Interacted well with staff.  Denies suicidal thoughts, auditory and visual hallucinations.  Offered support and encouragement as needed.  Patient remained safe on the unit.

## 2018-08-25 NOTE — Progress Notes (Signed)
D: Pt denies SI/HI/AV hallucinations. Pt is irritable and depressed. Pt has been in room most of shift. Patient engages in conversation minimal.  A: Pt was offered support and encouragement. Pt was given scheduled medications. Pt was encourage to attend groups. Q 15 minute checks were done for safety.  R: Pt has no complaints.Patient  safety maintained on unit.

## 2018-08-25 NOTE — B[facility] Group Notes (Signed)
Pt did not attend wrap up group this evening.  

## 2018-08-25 NOTE — [facility] Group Notes (Addendum)
  Type of Therapy: Mental [employer] Presentation  Participation Level: Did Not Attend.    Modes of Intervention: Discussion, Education and Socialization  Summary of Progress/Problems: Mental Health Association (MHA) Speaker came to talk about his personal journey with mental health. The pt processed ways by which to relate to the speaker. MHA speaker provided handouts and educational information pertaining to groups and services offered by the MHA. Pt was engaged in speaker's presentation and was receptive to resources provided.   Invited, chose not to attend.     Nykeem Citro, MSW, LCSWA Clinical Social Worker [facility]  Phone: [phone]  

## 2018-08-25 NOTE — Progress Notes (Addendum)
[facility] ([facility]) MD Progress Note  08/25/2018 2:42 PM CYNDEE GIAMMARCO  MRN:  [id number] Subjective: patient reports she is feeling somewhat better, and presents more future oriented. Today focused on medication issues- states she remembers a past Wellbutrin trial was particularly effective, and is also interested in this medication option as states she remembers she lost weight, and that it may help her decrease/stop smoking cigarettes.  At this time denies suicidal ideations.   Objective: Have discussed case with treatment team and have met with patient. 33 year old female, known to our unit from a prior admission in October 2019 at which time presented for depression and OTC overdose.  Presented to the hospital due to depression, passive SI, neurovegetative symptoms of depression.  Presents dysphoric, irritable.  Reports abusing cocaine and alcohol regularly but not daily, states she has been off psychiatric medications for several weeks.   Today patient presents significantly improved compared to initial presentation.  She is out of bed/out of her room and visible in milieu.  She presents more communicative, with better eye contact, overall better related, less irritable.  Currently future oriented and as above focused on medication issues and on disposition planning options.  As above patient reports a history of prior trial with Wellbutrin which she remembers as helpful, well-tolerated/effective.  Of note patient denies any history of seizure disorder or head trauma.  We also discussed options for insomnia.  She states she does not feel Remeron is significantly helpful and is interested in switching to some other medication for sleep if needed .  She is denying suicidal ideations today, more focused on disposition planning, states she may eventually return to [place] state but that this would not be until later this year and therefore needs local outpatient treatment after discharge.  Limited group  participation but as above more visible in milieu today. No disruptive or agitated behaviors.    Principal Problem: Bipolar Disorder by history/ depression Diagnosis: Active Problems:   Bipolar 1 disorder, depressed, severe ([place])  Total Time spent with patient: 20 minutes  Past Psychiatric History:   Past Medical History:  Past Medical History:  Diagnosis Date  . Diabetes mellitus without complication ([place])   . Fibroids   . Gestational diabetes   . Hypertension   . Mental disorder    bipolar    Past Surgical History:  Procedure Laterality Date  . CESAREAN SECTION     x 4  . CESAREAN SECTION N/A 08/08/2014   Procedure: CESAREAN SECTION;  Surgeon: Truett Mainland, DO;  Location: [facility];  Service: Obstetrics;  Laterality: N/A;  repeat  . DILATION AND CURETTAGE OF UTERUS     Family History:  Family History  Problem Relation Age of Onset  . Cancer Mother    Family Psychiatric  History:  Social History:  Social History   Substance and Sexual Activity  Alcohol Use Yes  . Alcohol/week: 10.0 - 12.0 standard drinks  . Types: 10 - 12 Cans of beer per week   Comment: Pt admits drinking daily     Social History   Substance and Sexual Activity  Drug Use Yes  . Types: Cocaine    Social History   Socioeconomic History  . Marital status: Single    Spouse name: Not on file  . Number of children: Not on file  . Years of education: Not on file  . Highest education level: Not on file  Occupational History  . Not on file  Social Needs  . Financial  resource strain: Not on file  . Food insecurity:    Worry: Not on file    Inability: Not on file  . Transportation needs:    Medical: Not on file    Non-medical: Not on file  Tobacco Use  . Smoking status: Current Some Day Smoker    Packs/day: 0.50    Types: Cigarettes  . Smokeless tobacco: Never Used  . Tobacco comment: Pt declined  Substance and Sexual Activity  . Alcohol use: Yes    Alcohol/week: 10.0 - 12.0 standard  drinks    Types: 10 - 12 Cans of beer per week    Comment: Pt admits drinking daily  . Drug use: Yes    Types: Cocaine  . Sexual activity: Yes    Birth control/protection: None  Lifestyle  . Physical activity:    Days per week: Not on file    Minutes per session: Not on file  . Stress: Not on file  Relationships  . Social connections:    Talks on phone: Not on file    Gets together: Not on file    Attends religious service: Not on file    Active member of club or organization: Not on file    Attends meetings of clubs or organizations: Not on file    Relationship status: Not on file  Other Topics Concern  . Not on file  Social History Narrative   ** Merged History Encounter **       Additional Social History:   Sleep: improving   Appetite:  improving   Current Medications: Current Facility-Administered Medications  Medication Dose Route Frequency Provider Last Rate Last Dose  . acetaminophen (TYLENOL) tablet 650 mg  650 mg Oral Q6H PRN Lindon Romp A, NP      . alum & mag hydroxide-simeth (MAALOX/MYLANTA) 200-200-20 MG/5ML suspension 30 mL  30 mL Oral Q4H PRN Lindon Romp A, NP      . amLODipine (NORVASC) tablet 10 mg  10 mg Oral Daily Lindon Romp A, NP   10 mg at 08/24/18 1033  . ARIPiprazole (ABILIFY) tablet 15 mg  15 mg Oral Daily Katelin Kutsch, Myer Peer, MD   15 mg at 08/24/18 1033  . ibuprofen (ADVIL,MOTRIN) tablet 600 mg  600 mg Oral Q8H PRN Lindon Romp A, NP   600 mg at 08/22/18 1215  . lisinopril (PRINIVIL,ZESTRIL) tablet 10 mg  10 mg Oral Daily Lindon Romp A, NP   10 mg at 08/24/18 1034  . magnesium hydroxide (MILK OF MAGNESIA) suspension 30 mL  30 mL Oral Daily PRN Lindon Romp A, NP      . mirtazapine (REMERON) tablet 7.5 mg  7.5 mg Oral QHS Lindon Romp A, NP   7.5 mg at 08/24/18 2155  . multivitamin with minerals tablet 1 tablet  1 tablet Oral Daily Heidi Maclin, Myer Peer, MD   1 tablet at 08/24/18 1034  . nicotine (NICODERM CQ - dosed in mg/24 hours) patch 21 mg  21  mg Transdermal Daily PRN Lindon Romp A, NP      . ondansetron (ZOFRAN) tablet 4 mg  4 mg Oral Q8H PRN Lindon Romp A, NP      . thiamine (VITAMIN B-1) tablet 100 mg  100 mg Oral Daily Kaiden Pech, Myer Peer, MD   100 mg at 08/24/18 1033    Lab Results:  No results found for this or any previous visit (from the past 24 hour(s)).  Blood Alcohol level:  Lab Results  Component Value Date  ETH <10 08/21/2018   ETH <10 17/00/1749    Metabolic Disorder Labs: Lab Results  Component Value Date   HGBA1C 6.1 (H) 08/23/2018   MPG 128.37 08/23/2018   MPG 131 05/04/2018   Lab Results  Component Value Date   PROLACTIN 13.9 05/04/2018   Lab Results  Component Value Date   CHOL 138 05/04/2018   TRIG 153 (H) 05/04/2018   HDL 38 (L) 05/04/2018   CHOLHDL 3.6 05/04/2018   VLDL 31 05/04/2018   LDLCALC 69 05/04/2018    Physical Findings: AIMS: Facial and Oral Movements Muscles of Facial Expression: None, normal Lips and Perioral Area: None, normal Jaw: None, normal Tongue: None, normal,Extremity Movements Upper (arms, wrists, hands, fingers): None, normal Lower (legs, knees, ankles, toes): None, normal, Trunk Movements Neck, shoulders, hips: None, normal, Overall Severity Severity of abnormal movements (highest score from questions above): None, normal Incapacitation due to abnormal movements: None, normal Patient's awareness of abnormal movements (rate only patient's report): No Awareness, Dental Status Current problems with teeth and/or dentures?: No Does patient usually wear dentures?: No  CIWA:  CIWA-Ar Total: 0 COWS:     Musculoskeletal: Strength & Muscle Tone: within normal limits no significant distal tremors, no diaphoresis, no psychomotor restlessness at this time Gait & Station: normal Patient leans: N/A  Psychiatric Specialty Exam: Physical Exam  ROS denies chest pain, no shortness of breath, no vomiting  Blood pressure 133/89, pulse 93, temperature 98 F (36.7 C),  temperature source Oral, resp. rate 18, height '5\' 6"'  (1.676 m), weight 122.5 kg, last menstrual period 08/10/2018, SpO2 100 %.Body mass index is 43.58 kg/m.  General Appearance: Improving grooming  Eye Contact:  Improving eye contact  Speech:  Normal Rate  Volume:  Normal  Mood:  Partially improved mood, less significantly irritable  Affect:  Becoming more reactive, smiles briefly at times during session, less irritable  Thought Process:  Linear and Descriptions of Associations: Intact  Orientation:  Other:  Fully alert and attentive  Thought Content: Endorses occasionally hearing her name being called, denies other hallucinatory experiences, not currently internally preoccupied, no delusions expressed  Suicidal Thoughts:  No denies suicidal or self-injurious ideations at this time, denies homicidal ideations.  As above presents future oriented  Homicidal Thoughts:  No  Memory:  Recent and remote grossly intact  Judgement:  Fair  Insight:  Fair  Psychomotor Activity:  Improving  Concentration:  Concentration: Improving and Attention Span: Improving  Recall:  Good  Fund of Knowledge:  Good  Language:  Good  Akathisia:  Negative  Handed:  Right  AIMS (if indicated):     Assets:  Desire for Improvement Resilience  ADL's:  Intact  Cognition:  WNL  Sleep:  Number of Hours: 6.75   Assessment -  33 year old female, known to our unit from a prior admission in October 2019 at which time presented for depression and OTC overdose.  Presented to the hospital due to depression, passive SI, neurovegetative symptoms of depression.  Presents dysphoric, irritable.  Reports abusing cocaine and alcohol regularly but not daily, states she has been off psychiatric medications for several weeks.  Patient presents with improvement compared to admission. Today out of bed and visible on unit, better related and more communicative.  Denies suicidal ideations and presents future oriented. States she has  history of good response and tolerance to Wellbutrin trial in the past, and does not remember having had any side effects. She denies any history of seizures, head trauma, or of eating  disorder . Side effect profiles reviewed.    Treatment Plan Summary: Daily contact with patient to assess and evaluate symptoms and progress in treatment, Medication management, Plan Inpatient treatment and Medications as below  And plan reviewed as below today 1/23 Encourage group and milieu participation to work on coping skills and symptom reduction Encourage efforts to work on sobriety and relapse prevention Decrease  Abilify to 10  mg daily for mood disorder Discontinue Remeron Start Trazodone 50 mgrs QHS PRN for insomnia as needed  Start Wellbutrin XL 150 mgrs QAM for depression Continue Norvasc, Lisinopril for HTN Continue Ativan PRNs for alcohol WDL if needed  Check BMP in AM/monitor Creatinine. Treatment team working on disposition [place], MD 08/25/2018, 2:42 PM   Patient ID: Frankey Poot, female   DOB: 1986-06-21, 33 y.o.   MRN: [id number]

## 2018-08-25 NOTE — Progress Notes (Signed)
Per chart review, patient reports she has DSS involved with her youngest child Felicia Foster, DOB 08/08/2014). Patient has five children total (ages 37-4). Patient states her oldest child (35 year old "[name]"), is caring for her younger 4 children while the patient is hospitalized.   CSW spoke with [facility] Social Worker, Artelia Laroche, at [phone]. Ubaldo Glassing reports that there was an open CPS case, but it was closed on 08/18/2018. Ubaldo Glassing states oldest child, [name], has successfully completed a criminal background check and Ubaldo Glassing does not have concerns regarding patient's oldest child temporarily caring for the younger children. Ubaldo Glassing stated she will call [name], patient's oldest child, and Mr.Roda Shutters, father of two of patient's children, and offer them her support.  Patient did not give consent for CSW to contact family or other collateral contacts.   Stephanie Acre, LCSW-A Clinical Social Worker

## 2018-08-25 NOTE — Progress Notes (Signed)
Patient ID: Felicia Foster, female   DOB: Oct 25, 1985, 33 y.o.   MRN: [id number]   D: Patient has a flat affect on approach tonight. In bed sleeping at shift change but later came to window asking for medication. Previous nurse reports patient staying in the bed. Irritable on approach but minimal when trying to have a conversation. Reports that she doesn't feel any difference since first being admitted here. Not interacting with anyone tonight. Isolating in room. A: Staff will monitor on q 15 minute checks, follow treatment plan, and give medication as ordered. R: Took Remeron and went back to bed

## 2018-08-26 DIAGNOSIS — F1424 Cocaine dependence with cocaine-induced mood disorder: Secondary | ICD-10-CM

## 2018-08-26 LAB — BASIC METABOLIC PANEL
Anion gap: 10 (ref 5–15)
BUN: 12 mg/dL (ref 6–20)
CO2: 25 mmol/L (ref 22–32)
Calcium: 9.3 mg/dL (ref 8.9–10.3)
Chloride: 104 mmol/L (ref 98–111)
Creatinine, Ser: 0.93 mg/dL (ref 0.44–1.00)
GFR calc Af Amer: >60 mL/min (ref >60)
GFR calc non Af Amer: >60 mL/min (ref >60)
Glucose, Bld: 118 mg/dL — ABNORMAL HIGH (ref 70–99)
Potassium: 3.4 mmol/L — ABNORMAL LOW (ref 3.5–5.1)
Sodium: 139 mmol/L (ref 135–145)

## 2018-08-26 MED ORDER — ARIPIPRAZOLE 10 MG PO TABS: 10.0000 mg | ORAL_TABLET | Freq: Every day | ORAL | 0 refills | Status: DC

## 2018-08-26 MED ORDER — AMLODIPINE BESYLATE 10 MG PO TABS: 10.0000 mg | ORAL_TABLET | Freq: Every day | ORAL | 0 refills | Status: DC

## 2018-08-26 MED ORDER — TRAZODONE HCL 50 MG PO TABS: 50.0000 mg | ORAL_TABLET | Freq: Every evening | ORAL | 0 refills | Status: DC | PRN

## 2018-08-26 MED ORDER — BUPROPION HCL ER (XL) 150 MG PO TB24: 150.0000 mg | ORAL_TABLET | Freq: Every day | ORAL | 0 refills | Status: DC

## 2018-08-26 MED ORDER — NICOTINE 21 MG/24HR TD PT24: 21.0000 mg | MEDICATED_PATCH | Freq: Every day | TRANSDERMAL | 0 refills | Status: DC | PRN

## 2018-08-26 MED ORDER — LISINOPRIL 10 MG PO TABS: 10.0000 mg | ORAL_TABLET | Freq: Every day | ORAL | 0 refills | Status: DC

## 2018-08-26 MED ORDER — ADULT MULTIVITAMIN W/MINERALS CH: 1.0000 | ORAL_TABLET | Freq: Every day | ORAL | 0 refills | Status: DC

## 2018-08-26 NOTE — Progress Notes (Signed)
Recreation Therapy Notes  Date:  1.24.20 Time: 0930 Location: [address] Dayroom  Group Topic: Stress Management  Goal Area(s) Addresses:  Patient will identify positive stress management techniques. Patient will identify benefits of using stress management post d/c.  Intervention: Stress Management  Activity :  Meditation.  LRT played a meditation that dealt with having choice.  Patients were to listen to the meditation to follow along and engage.  Education:  Stress Management, Discharge Planning.   Education Outcome: Acknowledges Education  Clinical Observations/Feedback:  Pt did not attend group.     Shameca Landen, LRT/CTRS         Ordean Fouts A 08/26/2018 11:01 AM 

## 2018-08-26 NOTE — [facility] Suicide Risk Assessment (Signed)
[facility] Discharge Suicide Risk Assessment   Principal Problem: Depression Discharge Diagnoses: Active Problems:   Bipolar 1 disorder, depressed, severe ([facility])   Total Time spent with patient: 30 minutes  Musculoskeletal: Strength & Muscle Tone: within normal limits Gait & Station: normal Patient leans: N/A  Psychiatric Specialty Exam: ROS denies chest pain, no shortness of breath, no nausea, no vomiting , no fever   Blood pressure (!) 155/101, pulse (!) 101, temperature 98.7 F (37.1 C), temperature source Oral, resp. rate 16, height 5\' 6"  (1.676 m), weight 122.5 kg, last menstrual period 08/10/2018, SpO2 100 %.Body mass index is 43.58 kg/m.  General Appearance: Fairly Groomed  Engineer, water::  improving   Speech:  Normal Rate409  Volume:  Normal  Mood:  states has been feeling better, does present with improving mood   Affect:  less irritable, more reactive, smiles and even laughs appropriately during session today  Thought Process:  Linear and Descriptions of Associations: Intact  Orientation:  Full (Time, Place, and Person)  Thought Content:  no hallucinations, no delusions , does not appear internally preoccupied. Regarding recent report she had heard her name being called states " I figured out it was really one of the Nurses calling me"  Suicidal Thoughts:  No denies suicidal or self injurious ideations, denies homicidal or violent ideations  Homicidal Thoughts:  No  Memory:  recent and remote grossly intact   Judgement:  Fair/ improving   Insight:  fair  Psychomotor Activity:  Normal  Concentration:  Good  Recall:  Good  Fund of Knowledge:Good  Language: Good  Akathisia:  Negative  Handed:  Right  AIMS (if indicated):     Assets:  Communication Skills Desire for Improvement Social Support  Sleep:  Number of Hours: 4.25  Cognition: WNL  ADL's:  Intact   Mental Status Per Nursing Assessment::   On Admission:  Self-harm thoughts  Demographic Factors:  33, single, had  been living with BF, who recently left . She is on disability  Loss Factors: limited support network, recent separation  Historical Factors: History of prior psychiatric admissions, history of prior diagnosis of Bipolar Disorder, history of PTSD symptoms, history of Cocaine Abuse   Risk Reduction Factors:   Responsible for children under 76 years of age, Sense of responsibility to family, Living with another person, especially a relative and Positive coping skills or problem solving skills  Continued Clinical Symptoms:  At this time patient is alert, attentive, calm, mood is improved and presents noticeably less irritable ,better related and with a more reactive affect today. No thought disorder, no SI or HI, no psychotic symptoms, future oriented, states she may decide to return to [place] later this year , as she has a better support system there. Today planning on taking her son to the pediatrician . No agitated behaviors on unit. Currently denies medication side effects. Tolerating Wellbutrin /Abilify well thus far . Side effects reviewed, including potential risk of seizures and increased BP with Buproprion ( patient has reported she responded well to Wellbutrin in the past and was well tolerated )  Labs reviewed . Creatinine improved to 0.93  Cognitive Features That Contribute To Risk:  No gross cognitive deficits noted upon discharge. Is alert , attentive, and oriented x 3   Suicide Risk:  Mild:  Suicidal ideation of limited frequency, intensity, duration, and specificity.  There are no identifiable plans, no associated intent, mild dysphoria and related symptoms, good self-control (both objective and subjective assessment), few other risk  factors, and identifiable protective factors, including available and accessible social support.  Follow-up Information    [facility] Go on 08/30/2018.   Why:  Please walk in for therapy and medication management services on Tuesday,  1/28 at 9:00a. Bring your photo ID, proof of insurance, SSN, current medications, and any discharge paperwork from this hospitalization. Contact information: [address] [place] [address] p: Yoder: [phone]          Plan Of Care/Follow-up recommendations:  Activity:  as tolerated  Diet:  Heart Healthy Tests:  NA Other:  See below  Patient is expressing readiness for discharge and there are no current grounds for involuntary commitment . Reports she is leaving in good spirits and plans to return home and follow up as above . Patient encouraged to establish regular outpatient treatment with PCP for medical management as needed. Referred to [place], MD 08/26/2018, 9:08 AM

## 2018-08-26 NOTE — Discharge Summary (Addendum)
Physician Discharge Summary Note  Patient:  Felicia Foster is an 33 y.o., female MRN:  [id number] DOB:  10/13/1985 Patient phone:  [phone] (home)  Patient address:   [place] [address],  Total Time spent with patient: 15 minutes  Date of Admission:  08/22/2018 Date of Discharge: 08/26/2018  Reason for Admission:  Suicidal ideation  Principal Problem: Bipolar 1 disorder, depressed, severe ([place]) Discharge Diagnoses: Principal Problem:   Bipolar 1 disorder, depressed, severe ([place]) Active Problems:   Cocaine dependence with cocaine-induced mood disorder [facility])   Past Psychiatric History: Per admission H&P: history of several psychiatric admissions in the past, starting as a child, most recently in October 2019 ( for depression, overdose) . History of suicide attempt by overdosing .Reports she has been diagnosed with Bipolar Disorder in the past, but states " I am not sure if I have it".  Describes history of PTSD symptoms as above . Denies history of violence .  Past Medical History:  Past Medical History:  Diagnosis Date  . Diabetes mellitus without complication ([place])   . Fibroids   . Gestational diabetes   . Hypertension   . Mental disorder    bipolar    Past Surgical History:  Procedure Laterality Date  . CESAREAN SECTION     x 4  . CESAREAN SECTION N/A 08/08/2014   Procedure: CESAREAN SECTION;  Surgeon: Truett Mainland, DO;  Location: [facility];  Service: Obstetrics;  Laterality: N/A;  repeat  . DILATION AND CURETTAGE OF UTERUS     Family History:  Family History  Problem Relation Age of Onset  . Cancer Mother    Family Psychiatric  History: Per admission H&P: none Social History:  Social History   Substance and Sexual Activity  Alcohol Use Yes  . Alcohol/week: 10.0 - 12.0 standard drinks  . Types: 10 - 12 Cans of beer per week   Comment: Pt admits drinking daily     Social History   Substance and Sexual Activity  Drug Use Yes  . Types:  Cocaine    Social History   Socioeconomic History  . Marital status: Single    Spouse name: Not on file  . Number of children: Not on file  . Years of education: Not on file  . Highest education level: Not on file  Occupational History  . Not on file  Social Needs  . Financial resource strain: Not on file  . Food insecurity:    Worry: Not on file    Inability: Not on file  . Transportation needs:    Medical: Not on file    Non-medical: Not on file  Tobacco Use  . Smoking status: Current Some Day Smoker    Packs/day: 0.50    Types: Cigarettes  . Smokeless tobacco: Never Used  . Tobacco comment: Pt declined  Substance and Sexual Activity  . Alcohol use: Yes    Alcohol/week: 10.0 - 12.0 standard drinks    Types: 10 - 12 Cans of beer per week    Comment: Pt admits drinking daily  . Drug use: Yes    Types: Cocaine  . Sexual activity: Yes    Birth control/protection: None  Lifestyle  . Physical activity:    Days per week: Not on file    Minutes per session: Not on file  . Stress: Not on file  Relationships  . Social connections:    Talks on phone: Not on file    Gets together: Not on  file    Attends religious service: Not on file    Active member of club or organization: Not on file    Attends meetings of clubs or organizations: Not on file    Relationship status: Not on file  Other Topics Concern  . Not on file  Social History Narrative   ** Merged History Encounter [facility] Course:  Per admission H&P 08/22/2018: 33 year old female, presented to ED voluntarily , via [employer] which she states she had contacted . At this time patient is irritable, dysphoric ,and only partially cooperative with interview. States she has been feeling depressed, irritable, and endorses recent suicidal ideations, which she currently describes as passive. Admission UDS positive for Amphetamines, Cocaine, Cannabis.  She denies amphetamine abuse and states she does not know why it is  positive. She does endorse cocaine abuse, and states she has been using " about once or twice  a week". Admission BAL negative. Reports she has been drinking 2-3 times a week, in varying amounts depending on " whether I have money or not". She is unsure when she last drank. Endorses some neuro-vegetative symptoms of depression as below. Reports occasional auditory hallucinations, but not today. Does not currently present internally preoccupied . Of note, patient had been admitted to [facility] in October 2019- at the time for worsening depression, Benadryl overdose . Was diagnosed with  Bipolar Disorder, Cocaine Abuse, Substance Induced Mood Disorder. Was discharged on Remeron/Abilify. Stressors include not taking any of her prescribed medications for several weeks. Father of her youngest child recently left her.  Ms. Farrey was admitted for suicidal ideation, in the context of polysubstance use. She had been off of her medications for several weeks. She was restarted on Remeron and Abilify. She was also restarted on lisinopril and amlodipine for HTN. She did not report withdrawal symptoms during hospitalization. Remeron was discontinued and Wellbutrin started at patient request. Ms. Zavadil remained on the [facility] unit for 5 days. She stabilized with medication and therapy. She was discharged on the medications listed below. She has shown improvement with improved mood, affect, sleep, appetite, and interaction. She denies any SI/HI/AVH and contracts for safety. Patient agrees to follow up at [facility] and [facility]. Patient is provided with prescriptions for medications upon discharge.  Physical Findings: AIMS: Facial and Oral Movements Muscles of Facial Expression: None, normal Lips and Perioral Area: None, normal Jaw: None, normal Tongue: None, normal,Extremity Movements Upper (arms, wrists, hands, fingers): None, normal Lower (legs, knees, ankles, toes): None, normal, Trunk  Movements Neck, shoulders, hips: None, normal, Overall Severity Severity of abnormal movements (highest score from questions above): None, normal Incapacitation due to abnormal movements: None, normal Patient's awareness of abnormal movements (rate only patient's report): No Awareness, Dental Status Current problems with teeth and/or dentures?: No Does patient usually wear dentures?: No  CIWA:  CIWA-Ar Total: 0 COWS:     Musculoskeletal: Strength & Muscle Tone: within normal limits Gait & Station: normal Patient leans: N/A  Psychiatric Specialty Exam: Physical Exam  Nursing note and vitals reviewed. Constitutional: She is oriented to person, place, and time. She appears well-developed and well-nourished.  Cardiovascular: Normal rate.  Respiratory: Effort normal.  Neurological: She is alert and oriented to person, place, and time.    Review of Systems  Constitutional: Negative.   Respiratory: Negative.   Cardiovascular: Negative.   Psychiatric/Behavioral: Positive for depression (improving) and substance abuse (UDS +cocaine, THC, amphetamines). Negative for  hallucinations, memory loss and suicidal ideas. The patient is not nervous/anxious and does not have insomnia.     Blood pressure (!) 155/101, pulse (!) 101, temperature 98.7 F (37.1 C), temperature source Oral, resp. rate 16, height 5\' 6"  (1.676 m), weight 122.5 kg, last menstrual period 08/10/2018, SpO2 100 %.Body mass index is 43.58 kg/m.  See MD's discharge SRA     Have you used any form of tobacco in the last 30 days? (Cigarettes, Smokeless Tobacco, Cigars, and/or Pipes): Yes  Has this patient used any form of tobacco in the last 30 days? (Cigarettes, Smokeless Tobacco, Cigars, and/or Pipes) Yes, an FDA-approved medication for tobacco cessation was offered at discharge.   Blood Alcohol level:  Lab Results  Component Value Date   ETH <10 08/21/2018   ETH <10 95/28/4132    Metabolic Disorder Labs:  Lab Results   Component Value Date   HGBA1C 6.1 (H) 08/23/2018   MPG 128.37 08/23/2018   MPG 131 05/04/2018   Lab Results  Component Value Date   PROLACTIN 13.9 05/04/2018   Lab Results  Component Value Date   CHOL 138 05/04/2018   TRIG 153 (H) 05/04/2018   HDL 38 (L) 05/04/2018   CHOLHDL 3.6 05/04/2018   VLDL 31 05/04/2018   LDLCALC 69 05/04/2018    See Psychiatric Specialty Exam and Suicide Risk Assessment completed by Attending Physician prior to discharge.  Discharge destination:  Home  Is patient on multiple antipsychotic therapies at discharge:  No   Has Patient had three or more failed trials of antipsychotic monotherapy by history:  No  Recommended Plan for Multiple Antipsychotic Therapies: NA  Discharge Instructions    Discharge instructions   Complete by:  As directed    Patient is instructed to take all prescribed medications as recommended. Report any side effects or adverse reactions to your outpatient psychiatrist. Patient is instructed to abstain from alcohol and illegal drugs while on prescription medications. In the event of worsening symptoms, patient is instructed to call the crisis hotline, 911, or go to the nearest emergency department for evaluation and treatment.     Allergies as of 08/26/2018   No Known Allergies     Medication List    STOP taking these medications   diphenhydrAMINE 25 MG tablet Commonly known as:  BENADRYL   diphenhydramine-acetaminophen 25-500 MG Tabs tablet Commonly known as:  TYLENOL PM   mirtazapine 7.5 MG tablet Commonly known as:  REMERON     TAKE these medications     Indication  amLODipine 10 MG tablet Commonly known as:  NORVASC Take 1 tablet (10 mg total) by mouth daily. For high blood pressure What changed:  additional instructions  Indication:  High Blood Pressure Disorder   ARIPiprazole 10 MG tablet Commonly known as:  ABILIFY Take 1 tablet (10 mg total) by mouth daily. For mood Start taking on:  August 27, 2018 What changed:  additional instructions  Indication:  mood stability   buPROPion 150 MG 24 hr tablet Commonly known as:  WELLBUTRIN XL Take 1 tablet (150 mg total) by mouth daily. For mood Start taking on:  August 27, 2018  Indication:  Mood   lisinopril 10 MG tablet Commonly known as:  PRINIVIL,ZESTRIL Take 1 tablet (10 mg total) by mouth daily. For high blood pressure What changed:  additional instructions  Indication:  High Blood Pressure Disorder   multivitamin with minerals Tabs tablet Take 1 tablet by mouth daily. (May buy over the counter) Start taking  on:  August 27, 2018  Indication:  Supplementation   nicotine 21 mg/24hr patch Commonly known as:  NICODERM CQ - dosed in mg/24 hours Place 1 patch (21 mg total) onto the skin daily as needed (patient request). (May buy over the counter)  Indication:  Nicotine Addiction   traZODone 50 MG tablet Commonly known as:  DESYREL Take 1 tablet (50 mg total) by mouth at bedtime as needed for sleep.  Indication:  Trouble Sleeping      Follow-up Information    [facility] Go on 08/30/2018.   Why:  Please walk in for therapy and medication management services on Tuesday, 1/28 at 9:00a. Bring your photo ID, proof of insurance, SSN, current medications, and any discharge paperwork from this hospitalization. Contact information: [place] [address] p: [place]: [phone]       [facility] [employer] Follow up on 09/02/2018.   Why:  Your hospital follow up appointment with Dr. Ethelene Hal is 1/31 at 9:30a. Please bring your photo ID, proof of insurance, your co-pay, and discharge paperwork from this hospitalization.  Contact information: [place] [address] p: [phone] f: [phone]          Follow-up recommendations: Activity as tolerated. Diet as recommended by primary care physician. Keep all scheduled follow-up appointments as  recommended.   Comments:   Patient is instructed to take all prescribed medications as recommended. Report any side effects or adverse reactions to your outpatient psychiatrist. Patient is instructed to abstain from alcohol and illegal drugs while on prescription medications. In the event of worsening symptoms, patient is instructed to call the crisis hotline, 911, or go to the nearest emergency department for evaluation and treatment.  Signed: Connye Burkitt, NP 08/26/2018, 12:58 PM   Patient seen, Suicide Assessment Completed.  Disposition Plan Reviewed

## 2018-08-26 NOTE — Progress Notes (Signed)
D Patient is prepared for dc by this Probation officer. She completed her daily assessment and on this she wrote she denied SI today and she rated her depression, hopelessness and anxeity " 0/0/0/", resecptively. Her dc instructions are reviewed with her by this Probation officer and she stated verbal understanding and willingness to comply. SHe is given all belongings that were previously in her locker, she is given 30 day prescriptions for continuing medications and then she is escorted to bldg entrance and dc'd ambulatory.

## 2018-09-02 ENCOUNTER — Ambulatory Visit: Payer: Self-pay | Admitting: Family Medicine

## 2018-09-02 DIAGNOSIS — Z0289 Encounter for other administrative examinations: Secondary | ICD-10-CM

## 2018-09-22 ENCOUNTER — Encounter: Payer: Self-pay | Admitting: General Practice

## 2018-10-14 ENCOUNTER — Other Ambulatory Visit: Payer: Self-pay

## 2018-10-14 ENCOUNTER — Encounter (HOSPITAL_COMMUNITY): Payer: Self-pay | Admitting: Emergency Medicine

## 2018-10-14 ENCOUNTER — Ambulatory Visit (HOSPITAL_COMMUNITY)
Admission: EM | Admit: 2018-10-14 | Discharge: 2018-10-14 | Disposition: A | Discharge status: Home / Self Care | Payer: Medicare HMO | Attending: Family Medicine | Admitting: Family Medicine

## 2018-10-14 DIAGNOSIS — N760 Acute vaginitis: Secondary | ICD-10-CM | POA: Diagnosis not present

## 2018-10-14 LAB — POCT URINALYSIS DIP (DEVICE)
Glucose, UA: NEGATIVE mg/dL
Hgb urine dipstick: NEGATIVE
Leukocytes,Ua: NEGATIVE
NITRITE: NEGATIVE
Protein, ur: 30 mg/dL — AB
Specific Gravity, Urine: 1.02 (ref 1.005–1.030)
Urobilinogen, UA: 0.2 mg/dL (ref 0.0–1.0)
pH: 6.5 (ref 5.0–8.0)

## 2018-10-14 LAB — POCT PREGNANCY, URINE: Preg Test, Ur: NEGATIVE

## 2018-10-14 MED ORDER — FLUCONAZOLE 150 MG PO TABS: 150.0000 mg | ORAL_TABLET | Freq: Every day | ORAL | 0 refills | Status: DC

## 2018-10-14 NOTE — ED Triage Notes (Signed)
Pt reports a new sexual partner in the last three days with two encounters without protection.  After both encounters she has felt very sore, irritated and itchy.  She states the inside of her vagina burns when she urinates like when you have an open cut.

## 2018-10-14 NOTE — Discharge Instructions (Signed)
I do not see any concerning signs on your exam We will go ahead and treat you for yeast infection and send the swab for further testing We will call with any positive results.

## 2018-10-14 NOTE — ED Provider Notes (Signed)
[facility]    CSN: [id number] Arrival date & time: 10/14/18  1114     History   Chief Complaint Chief Complaint  Patient presents with  . Vaginal Itching    HPI Felicia Foster is a 33 y.o. female.   Patient is a 33 year old female who presents with vaginal burning, itching, irritation.  This started approximate 2 days ago after having sexual intercourse without protection.  She did have some mild dyspareunia.  No bleeding.  Mild dysuria.  Symptoms have been waxing waning.  She has not done anything to treat her symptoms.  Denies any abdominal pain, back pain, pelvic pain, fevers, chills. Patient's last menstrual period was 10/06/2018 (exact date).   ROS per HPI    Vaginal Itching     Past Medical History:  Diagnosis Date  . Diabetes mellitus without complication ([place])   . Fibroids   . Gestational diabetes   . Hypertension   . Mental disorder    bipolar    Patient Active Problem List   Diagnosis Date Noted  . Cocaine dependence with cocaine-induced mood disorder ([place])   . Bipolar 1 disorder, depressed, severe ([place]) 08/22/2018  . Allergic rhinitis 05/04/2018  . Bipolar 1 disorder, depressed ([place]) 05/03/2018  . Status post cesarean section 08/08/2014  . Previous preterm delivery x 3 (33, 36 and 35 weeks), antepartum 06/18/2014  . Previous cesarean delivery x 4, antepartum 06/18/2014  . History of bipolar disorder 06/12/2014  . History of gestational diabetes 06/12/2014  . History of congenital or genetic condition 06/12/2014  . Limited prenatal care in third trimester   . 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 Gestational diabetes mellitus, antepartum     Past Surgical History:  Procedure Laterality Date  . CESAREAN SECTION     x 4  . CESAREAN SECTION N/A 08/08/2014   Procedure: CESAREAN SECTION;  Surgeon: Truett Mainland, DO;  Location: [facility];  Service: Obstetrics;  Laterality: N/A;  repeat  . DILATION AND  CURETTAGE OF UTERUS      OB History    Gravida  8   Para  5   Term  2   Preterm  3   AB  3   Living  5     SAB  1   TAB  2   Ectopic  0   Multiple  0   Live Births  5            Home Medications    Prior to Admission medications   Medication Sig Start Date End Date Taking? Authorizing Provider  ARIPiprazole (ABILIFY) 10 MG tablet Take 1 tablet (10 mg total) by mouth daily. For mood 08/27/18  Yes Connye Burkitt, NP  buPROPion (WELLBUTRIN XL) 150 MG 24 hr tablet Take 1 tablet (150 mg total) by mouth daily. For mood 08/27/18  Yes Connye Burkitt, NP  amLODipine (NORVASC) 10 MG tablet Take 1 tablet (10 mg total) by mouth daily. For high blood pressure 08/26/18   Connye Burkitt, NP  fluconazole (DIFLUCAN) 150 MG tablet Take 1 tablet (150 mg total) by mouth daily. 10/14/18   Loura Halt A, NP  lisinopril (PRINIVIL,ZESTRIL) 10 MG tablet Take 1 tablet (10 mg total) by mouth daily. For high blood pressure 08/26/18   Connye Burkitt, NP  Multiple Vitamin (MULTIVITAMIN WITH MINERALS) TABS tablet Take 1 tablet by mouth daily. (May buy over the counter) 08/27/18   Harriett Sine  E, NP  nicotine (NICODERM CQ - DOSED IN MG/24 HOURS) 21 mg/24hr patch Place 1 patch (21 mg total) onto the skin daily as needed (patient request). (May buy over the counter) 08/26/18   Connye Burkitt, NP  traZODone (DESYREL) 50 MG tablet Take 1 tablet (50 mg total) by mouth at bedtime as needed for sleep. 08/26/18   Connye Burkitt, NP    Family History Family History  Problem Relation Age of Onset  . Cancer Mother     Social History Social History   Tobacco Use  . Smoking status: Current Some Day Smoker    Packs/day: 0.50    Types: Cigarettes  . Smokeless tobacco: Never Used  . Tobacco comment: Pt declined  Substance Use Topics  . Alcohol use: Yes    Alcohol/week: 10.0 - 12.0 standard drinks    Types: 10 - 12 Cans of beer per week    Comment: Pt admits drinking daily  . Drug use: Yes    Types:  Cocaine     Allergies   Patient has no known allergies.   Review of Systems Review of Systems   Physical Exam Triage Vital Signs ED Triage Vitals  Enc Vitals Group     BP 10/14/18 1213 (!) 183/107     Pulse Rate 10/14/18 1213 (!) 109     Resp --      Temp 10/14/18 1213 98.3 F (36.8 C)     Temp Source 10/14/18 1213 Oral     SpO2 10/14/18 1213 96 %     Weight --      Height --      Head Circumference --      Peak Flow --      Pain Score 10/14/18 1211 8     Pain Loc --      Pain Edu? --      Excl. in [place]? --    No data found.  Updated Vital Signs BP (!) 183/107 (BP Location: Left Wrist)   Pulse (!) 109   Temp 98.3 F (36.8 C) (Oral)   LMP 10/06/2018 (Exact Date)   SpO2 96%   Visual Acuity Right Eye Distance:   Left Eye Distance:   Bilateral Distance:    Right Eye Near:   Left Eye Near:    Bilateral Near:     Physical Exam Vitals signs and nursing note reviewed.  Constitutional:      General: She is not in acute distress.    Appearance: Normal appearance. She is not ill-appearing, toxic-appearing or diaphoretic.  HENT:     Head: Normocephalic and atraumatic.     Nose: Nose normal.  Eyes:     Conjunctiva/sclera: Conjunctivae normal.  Neck:     Musculoskeletal: Normal range of motion.  Pulmonary:     Effort: Pulmonary effort is normal.  Abdominal:     Palpations: Abdomen is soft.     Tenderness: There is no abdominal tenderness.  Genitourinary:    Vagina: No vaginal discharge.     Comments: External vaginal area without lesions. Mild erythema and swelling.  No discharge    Internal exam without lesions, bleeding, discharge.  No CMT.  Musculoskeletal: Normal range of motion.  Skin:    General: Skin is warm and dry.  Neurological:     Mental Status: She is alert.      UC Treatments / Results  Labs (all labs ordered are listed, but only abnormal results are displayed) Labs Reviewed  POCT URINALYSIS DIP (  DEVICE) - Abnormal; Notable for  the following components:      Result Value   Bilirubin Urine SMALL (*)    Ketones, ur TRACE (*)    Protein, ur 30 (*)    All other components within normal limits  POC URINE PREG, ED  POCT PREGNANCY, URINE  CERVICOVAGINAL ANCILLARY ONLY    EKG None  Radiology No results found.  Procedures Procedures (including critical care time)  Medications Ordered in UC Medications - No data to display  Initial Impression / Assessment and Plan / UC Course  I have reviewed the triage vital signs and the nursing notes.  Pertinent labs & imaging results that were available during my care of the patient were reviewed by me and considered in my medical decision making (see chart for details).    Vaginitis  No obvious findings on pelvic  exam No discharge or lesions.  Based on symptoms we will go ahead and treat for yeast infection The irritation could be from sexual intercourse and dryness.  Swab sent for testing  Labs pending.  Pt understanding.  Final Clinical Impressions(s) / UC Diagnoses   Final diagnoses:  Vaginitis and vulvovaginitis     Discharge Instructions     I do not see any concerning signs on your exam We will go ahead and treat you for yeast infection and send the swab for further testing We will call with any positive results.     ED Prescriptions    Medication Sig Dispense Auth. Provider   fluconazole (DIFLUCAN) 150 MG tablet Take 1 tablet (150 mg total) by mouth daily. 2 tablet Loura Halt A, NP     Controlled Substance Prescriptions [place] Controlled Substance Registry consulted? Not Applicable   Orvan July, NP 10/14/18 1423

## 2018-10-17 LAB — CERVICOVAGINAL ANCILLARY ONLY
Bacterial vaginitis: POSITIVE — AB
CANDIDA VAGINITIS: POSITIVE — AB
Chlamydia: POSITIVE — AB
Neisseria Gonorrhea: NEGATIVE
Trichomonas: NEGATIVE

## 2018-10-18 ENCOUNTER — Telehealth (HOSPITAL_COMMUNITY): Payer: Self-pay | Admitting: Emergency Medicine

## 2018-10-18 ENCOUNTER — Encounter (HOSPITAL_COMMUNITY): Payer: Self-pay

## 2018-10-18 MED ORDER — METRONIDAZOLE 500 MG PO TABS: 500.0000 mg | ORAL_TABLET | Freq: Two times a day (BID) | ORAL | 0 refills | Status: AC

## 2018-10-18 MED ORDER — AZITHROMYCIN 250 MG PO TABS: 1000.0000 mg | ORAL_TABLET | Freq: Once | ORAL | 0 refills | Status: AC

## 2018-10-18 NOTE — Telephone Encounter (Signed)
Bacterial vaginosis is positive. This was not treated at the urgent care visit.  Flagyl 500 mg BID x 7 days #14 no refills sent to patients pharmacy of choice.    Candida (yeast) is positive.  Prescription for fluconazole was given at the urgent care visit.    Chlamydia is positive.  Rx po zithromax 1g #1 dose no refills was sent to the pharmacy of record.  Pt needs education to please refrain from sexual intercourse for 7 days to give the medicine time to work, sexual partners need to be notified and tested/treated.  Condoms may reduce risk of reinfection.  Recheck or followup with PCP for further evaluation if symptoms are not improving.   [facility] notified.  Attempted to reach patient. No answer at this time. Voicemail left.

## 2018-12-06 ENCOUNTER — Encounter (HOSPITAL_COMMUNITY): Payer: Self-pay | Admitting: Emergency Medicine

## 2018-12-06 ENCOUNTER — Emergency Department (HOSPITAL_COMMUNITY)
Admission: EM | Admit: 2018-12-06 | Discharge: 2018-12-06 | Disposition: A | Discharge status: Home / Self Care | Payer: Medicare HMO | Attending: Emergency Medicine | Admitting: Emergency Medicine

## 2018-12-06 ENCOUNTER — Other Ambulatory Visit: Payer: Self-pay

## 2018-12-06 DIAGNOSIS — E119 Type 2 diabetes mellitus without complications: Secondary | ICD-10-CM | POA: Insufficient documentation

## 2018-12-06 DIAGNOSIS — I10 Essential (primary) hypertension: Secondary | ICD-10-CM | POA: Insufficient documentation

## 2018-12-06 DIAGNOSIS — N898 Other specified noninflammatory disorders of vagina: Secondary | ICD-10-CM | POA: Diagnosis present

## 2018-12-06 DIAGNOSIS — T7421XD Adult sexual abuse, confirmed, subsequent encounter: Secondary | ICD-10-CM | POA: Insufficient documentation

## 2018-12-06 DIAGNOSIS — Z79899 Other long term (current) drug therapy: Secondary | ICD-10-CM | POA: Insufficient documentation

## 2018-12-06 DIAGNOSIS — F17210 Nicotine dependence, cigarettes, uncomplicated: Secondary | ICD-10-CM | POA: Insufficient documentation

## 2018-12-06 DIAGNOSIS — T7421XA Adult sexual abuse, confirmed, initial encounter: Secondary | ICD-10-CM

## 2018-12-06 LAB — WET PREP, GENITAL
Sperm: NONE SEEN
Trich, Wet Prep: NONE SEEN
Yeast Wet Prep HPF POC: NONE SEEN

## 2018-12-06 LAB — URINALYSIS, ROUTINE W REFLEX MICROSCOPIC
Bacteria, UA: NONE SEEN
Bilirubin Urine: NEGATIVE
Glucose, UA: NEGATIVE mg/dL
Hgb urine dipstick: NEGATIVE
Ketones, ur: NEGATIVE mg/dL
Leukocytes,Ua: NEGATIVE
Nitrite: NEGATIVE
Protein, ur: 30 mg/dL — AB
Specific Gravity, Urine: 1.031 — ABNORMAL HIGH (ref 1.005–1.030)
pH: 5 (ref 5.0–8.0)

## 2018-12-06 LAB — POC URINE PREG, ED: Preg Test, Ur: NEGATIVE

## 2018-12-06 MED ORDER — LISINOPRIL 10 MG PO TABS: 10.0000 mg | ORAL_TABLET | Freq: Every day | ORAL | 0 refills | Status: DC

## 2018-12-06 MED ORDER — LIDOCAINE HCL (PF) 1 % IJ SOLN
INTRAMUSCULAR | Status: AC
  Filled 2018-12-06: qty 5

## 2018-12-06 MED ORDER — AZITHROMYCIN 250 MG PO TABS
1000.0000 mg | ORAL_TABLET | Freq: Once | ORAL | Status: AC
  Administered 2018-12-06: 1000 mg via ORAL
  Filled 2018-12-06: qty 4

## 2018-12-06 MED ORDER — LISINOPRIL 10 MG PO TABS
10.0000 mg | ORAL_TABLET | Freq: Once | ORAL | Status: AC
  Administered 2018-12-06: 10 mg via ORAL
  Filled 2018-12-06: qty 1

## 2018-12-06 MED ORDER — AMLODIPINE BESYLATE 10 MG PO TABS: 10.0000 mg | ORAL_TABLET | Freq: Every day | ORAL | 0 refills | Status: DC

## 2018-12-06 MED ORDER — AMLODIPINE BESYLATE 5 MG PO TABS
10.0000 mg | ORAL_TABLET | Freq: Once | ORAL | Status: AC
  Administered 2018-12-06: 17:00:00 10 mg via ORAL
  Filled 2018-12-06: qty 2

## 2018-12-06 MED ORDER — CLINDAMYCIN PHOSPHATE 2 % VA CREA: 1.0000 | TOPICAL_CREAM | Freq: Every day | VAGINAL | 0 refills | Status: AC

## 2018-12-06 MED ORDER — CEFTRIAXONE SODIUM 250 MG IJ SOLR
250.0000 mg | Freq: Once | INTRAMUSCULAR | Status: AC
  Administered 2018-12-06: 250 mg via INTRAMUSCULAR
  Filled 2018-12-06: qty 250
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
## 2018-12-06 NOTE — Discharge Instructions (Addendum)
You are seen in the emergency department and checked for STDs.  Your wet prep showed that you have some bacterial vaginosis, we have prescribed you intravaginal clindamycin therapy to use.  Please take this as prescribed.  We have prescribed you new medication(s) today. Discuss the medications prescribed today with your pharmacist as they can have adverse effects and interactions with your other medicines including over the counter and prescribed medications. Seek medical evaluation if you start to experience new or abnormal symptoms after taking one of these medicines, seek care immediately if you start to experience difficulty breathing, feeling of your throat closing, facial swelling, or rash as these could be indications of a more serious allergic reaction  You were treated for gonorrhea and chlamydia in the ER.  We will call you should your gonorrhea, chlamydia, HIV, or syphilis test returned positive, if positive you will need to inform any sexual partners.  Follow-up with primary care within the next 3 to 5 days.  Please follow-up with above resources as well.  Return to the ER for new or worsening symptoms or any other concerns.
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
## 2018-12-07 LAB — HIV ANTIBODY (ROUTINE TESTING W REFLEX): HIV Screen 4th Generation wRfx: NONREACTIVE

## 2018-12-07 LAB — GC/CHLAMYDIA PROBE AMP (~~LOC~~) NOT AT ARMC
Chlamydia: NEGATIVE
Neisseria Gonorrhea: NEGATIVE

## 2018-12-07 LAB — RPR: RPR Ser Ql: NONREACTIVE

## 2018-12-28 ENCOUNTER — Telehealth: Payer: Self-pay | Admitting: Lactation Services

## 2018-12-28 ENCOUNTER — Other Ambulatory Visit: Payer: Self-pay | Admitting: General Practice

## 2018-12-28 DIAGNOSIS — B9689 Other specified bacterial agents as the cause of diseases classified elsewhere: Secondary | ICD-10-CM

## 2018-12-28 MED ORDER — METRONIDAZOLE 500 MG PO TABS: 500.0000 mg | ORAL_TABLET | Freq: Two times a day (BID) | ORAL | 0 refills | Status: DC

## 2018-12-28 NOTE — Telephone Encounter (Signed)
Pt called and left message on nurse voice mail. Pt is requesting her ED results and also wants to ask about a prescription. Routed to Clinical Pool for follow up.

## 2018-12-30 NOTE — Telephone Encounter (Signed)
Returned call to pt and she did not answer. I left VM message stating that I am returning her call. The office will re-open on 6/1 and we will attempt to call her back next week.

## 2019-03-07 ENCOUNTER — Emergency Department (HOSPITAL_COMMUNITY)
Admission: EM | Admit: 2019-03-07 | Discharge: 2019-03-07 | Disposition: A | Discharge status: Home / Self Care | Payer: Medicare HMO | Attending: Emergency Medicine | Admitting: Emergency Medicine

## 2019-03-07 ENCOUNTER — Emergency Department (HOSPITAL_COMMUNITY): Payer: Medicare HMO

## 2019-03-07 ENCOUNTER — Encounter (HOSPITAL_COMMUNITY): Payer: Self-pay | Admitting: Emergency Medicine

## 2019-03-07 DIAGNOSIS — R3915 Urgency of urination: Secondary | ICD-10-CM | POA: Diagnosis not present

## 2019-03-07 DIAGNOSIS — Z711 Person with feared health complaint in whom no diagnosis is made: Secondary | ICD-10-CM | POA: Diagnosis not present

## 2019-03-07 DIAGNOSIS — R350 Frequency of micturition: Secondary | ICD-10-CM | POA: Diagnosis not present

## 2019-03-07 DIAGNOSIS — F1410 Cocaine abuse, uncomplicated: Secondary | ICD-10-CM | POA: Diagnosis not present

## 2019-03-07 DIAGNOSIS — K439 Ventral hernia without obstruction or gangrene: Secondary | ICD-10-CM

## 2019-03-07 DIAGNOSIS — F17210 Nicotine dependence, cigarettes, uncomplicated: Secondary | ICD-10-CM | POA: Insufficient documentation

## 2019-03-07 DIAGNOSIS — I10 Essential (primary) hypertension: Secondary | ICD-10-CM | POA: Diagnosis not present

## 2019-03-07 DIAGNOSIS — Z20828 Contact with and (suspected) exposure to other viral communicable diseases: Secondary | ICD-10-CM | POA: Diagnosis not present

## 2019-03-07 DIAGNOSIS — Z79899 Other long term (current) drug therapy: Secondary | ICD-10-CM | POA: Insufficient documentation

## 2019-03-07 DIAGNOSIS — M545 Low back pain: Secondary | ICD-10-CM | POA: Diagnosis not present

## 2019-03-07 DIAGNOSIS — K5790 Diverticulosis of intestine, part unspecified, without perforation or abscess without bleeding: Secondary | ICD-10-CM

## 2019-03-07 DIAGNOSIS — E119 Type 2 diabetes mellitus without complications: Secondary | ICD-10-CM | POA: Diagnosis not present

## 2019-03-07 DIAGNOSIS — R300 Dysuria: Secondary | ICD-10-CM

## 2019-03-07 DIAGNOSIS — Z20822 Contact with and (suspected) exposure to covid-19: Secondary | ICD-10-CM

## 2019-03-07 LAB — CBC WITH DIFFERENTIAL/PLATELET
Abs Immature Granulocytes: 0.03 10*3/uL (ref 0.00–0.07)
Basophils Absolute: 0 10*3/uL (ref 0.0–0.1)
Basophils Relative: 0 %
Eosinophils Absolute: 0.1 10*3/uL (ref 0.0–0.5)
Eosinophils Relative: 2 %
HCT: 40.2 % (ref 36.0–46.0)
Hemoglobin: 12.4 g/dL (ref 12.0–15.0)
Immature Granulocytes: 0 %
Lymphocytes Relative: 33 %
Lymphs Abs: 2.4 10*3/uL (ref 0.7–4.0)
MCH: 24.3 pg — ABNORMAL LOW (ref 26.0–34.0)
MCHC: 30.8 g/dL (ref 30.0–36.0)
MCV: 78.8 fL — ABNORMAL LOW (ref 80.0–100.0)
Monocytes Absolute: 0.3 10*3/uL (ref 0.1–1.0)
Monocytes Relative: 4 %
Neutro Abs: 4.4 10*3/uL (ref 1.7–7.7)
Neutrophils Relative %: 61 %
Platelets: 277 10*3/uL (ref 150–400)
RBC: 5.1 MIL/uL (ref 3.87–5.11)
RDW: 17.4 % — ABNORMAL HIGH (ref 11.5–15.5)
WBC: 7.2 10*3/uL (ref 4.0–10.5)
nRBC: 0 % (ref 0.0–0.2)

## 2019-03-07 LAB — COMPREHENSIVE METABOLIC PANEL
ALT: 29 U/L (ref 0–44)
AST: 23 U/L (ref 15–41)
Albumin: 3.5 g/dL (ref 3.5–5.0)
Alkaline Phosphatase: 106 U/L (ref 38–126)
Anion gap: 9 (ref 5–15)
BUN: 12 mg/dL (ref 6–20)
CO2: 26 mmol/L (ref 22–32)
Calcium: 9.3 mg/dL (ref 8.9–10.3)
Chloride: 105 mmol/L (ref 98–111)
Creatinine, Ser: 1.16 mg/dL — ABNORMAL HIGH (ref 0.44–1.00)
GFR calc Af Amer: >60 mL/min (ref >60)
GFR calc non Af Amer: >60 mL/min (ref >60)
Glucose, Bld: 129 mg/dL — ABNORMAL HIGH (ref 70–99)
Potassium: 3.6 mmol/L (ref 3.5–5.1)
Sodium: 140 mmol/L (ref 135–145)
Total Bilirubin: 0.5 mg/dL (ref 0.3–1.2)
Total Protein: 7.3 g/dL (ref 6.5–8.1)

## 2019-03-07 LAB — URINALYSIS, ROUTINE W REFLEX MICROSCOPIC
Bilirubin Urine: NEGATIVE
Glucose, UA: NEGATIVE mg/dL
Hgb urine dipstick: NEGATIVE
Ketones, ur: NEGATIVE mg/dL
Nitrite: NEGATIVE
Protein, ur: 100 mg/dL — AB
Specific Gravity, Urine: 1.026 (ref 1.005–1.030)
WBC, UA: >50 WBC/hpf — ABNORMAL HIGH (ref 0–5)
pH: 7 (ref 5.0–8.0)

## 2019-03-07 LAB — LIPASE, BLOOD: Lipase: 41 U/L (ref 11–51)

## 2019-03-07 LAB — WET PREP, GENITAL
Sperm: NONE SEEN
Trich, Wet Prep: NONE SEEN
Yeast Wet Prep HPF POC: NONE SEEN

## 2019-03-07 LAB — I-STAT BETA HCG BLOOD, ED (MC, WL, AP ONLY): I-stat hCG, quantitative: <5 m[IU]/mL (ref <5)

## 2019-03-07 MED ORDER — METRONIDAZOLE 500 MG PO TABS: 500.0000 mg | ORAL_TABLET | Freq: Two times a day (BID) | ORAL | 0 refills | Status: DC

## 2019-03-07 MED ORDER — CEPHALEXIN 500 MG PO CAPS: 500.0000 mg | ORAL_CAPSULE | Freq: Two times a day (BID) | ORAL | 0 refills | Status: AC

## 2019-03-07 MED ORDER — LIDOCAINE HCL (PF) 1 % IJ SOLN
INTRAMUSCULAR | Status: AC
  Administered 2019-03-07: 1 mL
  Filled 2019-03-07: qty 5

## 2019-03-07 MED ORDER — AZITHROMYCIN 250 MG PO TABS
1000.0000 mg | ORAL_TABLET | Freq: Once | ORAL | Status: AC
  Administered 2019-03-07: 1000 mg via ORAL
  Filled 2019-03-07: qty 4

## 2019-03-07 MED ORDER — SODIUM CHLORIDE 0.9 % IV BOLUS
1000.0000 mL | Freq: Once | INTRAVENOUS | Status: AC
  Administered 2019-03-07: 1000 mL via INTRAVENOUS

## 2019-03-07 MED ORDER — CEFTRIAXONE SODIUM 250 MG IJ SOLR
250.0000 mg | Freq: Once | INTRAMUSCULAR | Status: AC
  Administered 2019-03-07: 250 mg via INTRAMUSCULAR
  Filled 2019-03-07: qty 250

## 2019-03-07 MED ORDER — ONDANSETRON 4 MG PO TBDP
4.0000 mg | ORAL_TABLET | Freq: Once | ORAL | Status: AC
  Administered 2019-03-07: 4 mg via ORAL
  Filled 2019-03-07: qty 1

## 2019-03-07 MED ORDER — IOHEXOL 300 MG/ML  SOLN
100.0000 mL | Freq: Once | INTRAMUSCULAR | Status: AC | PRN
  Administered 2019-03-07: 22:00:00 100 mL via INTRAVENOUS
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
## 2019-03-07 NOTE — ED Triage Notes (Signed)
Pt reports lower back pain with burning with urination. Pt denies any vaginal discharge pt states she took her temp this am and it was 100

## 2019-03-07 NOTE — Discharge Instructions (Addendum)
Today you have been treated for gonorrhea and chlamydia.  The test to determine if you have these will take a few days. They will only call you if your tests come back positive, no news is good news. In the result that your tests are positive you have already been treated.  Today your diagnosed with bacterial vaginosis and received a prescription for metronidazole also known as Flagyl. It is very important that you do not consume any alcohol while taking this medication as it will cause you to become violently ill.  You may have diarrhea from the antibiotics.  It is very important that you continue to take the antibiotics even if you get diarrhea unless a medical professional tells you that you may stop taking them.  If you stop too early the bacteria you are being treated for will become stronger and you may need different, more powerful antibiotics that have more side effects and worsening diarrhea.  Please stay well hydrated and consider probiotics as they may decrease the severity of your diarrhea.  Please be aware that if you take any hormonal contraception (birth control pills, nexplanon, the ring, etc) that your birth control will not work while you are taking antibiotics and you need to use back up protection as directed on the birth control medication information insert.   Please take Ibuprofen (Advil, motrin) and Tylenol (acetaminophen) to relieve your pain.  You may take up to 600 MG (3 pills) of normal strength ibuprofen every 8 hours as needed.  In between doses of ibuprofen you make take tylenol, up to 1,000 mg (two extra strength pills).  Do not take more than 3,000 mg tylenol in a 24 hour period.  Please check all medication labels as many medications such as pain and cold medications may contain tylenol.  Do not drink alcohol while taking these medications.  Do not take other NSAID'S while taking ibuprofen (such as aleve or naproxen).  Please take ibuprofen with food to decrease stomach  upset.  Please do not have any sexual contact for 2 weeks.

## 2019-03-08 LAB — RPR: RPR Ser Ql: NONREACTIVE

## 2019-03-08 LAB — HIV ANTIBODY (ROUTINE TESTING W REFLEX): HIV Screen 4th Generation wRfx: NONREACTIVE

## 2019-03-09 LAB — URINE CULTURE: Culture: >=100000 — AB

## 2019-03-09 LAB — GC/CHLAMYDIA PROBE AMP (~~LOC~~) NOT AT ARMC
Chlamydia: NEGATIVE
Neisseria Gonorrhea: NEGATIVE

## 2019-03-09 LAB — NOVEL CORONAVIRUS, NAA (HOSP ORDER, SEND-OUT TO REF LAB; TAT 18-24 HRS): SARS-CoV-2, NAA: NOT DETECTED

## 2019-03-10 ENCOUNTER — Telehealth: Payer: Self-pay | Admitting: *Deleted

## 2019-03-10 NOTE — Telephone Encounter (Signed)
Post ED Visit - Positive Culture Follow-up: Unsuccessful Patient Follow-up  Culture assessed and recommendations reviewed by:  []  Elenor Quinones, Pharm.D. []  Heide Guile, Pharm.D., BCPS AQ-ID []  Parks Neptune, Pharm.D., BCPS []  Alycia Rossetti, Pharm.D., BCPS []  [place], [place].D., BCPS, AAHIVP []  Legrand Como, Pharm.D., BCPS, AAHIVP []  Wynell Balloon, PharmD []  Vincenza Hews, PharmD, BCPS  Positive urine culture, reviewed by Eliezer Mccoy, PA  []  Patient discharged without antimicrobial prescription and treatment is now indicated [x]  Organism is resistant to prescribed ED discharge antimicrobial []  Patient with positive blood cultures  Plan: Continue Metronidazole, Stop Cephalexin.  Start Nitrofurantoin 100mg  BID x 5 days.  Unable to contact patient after 3 attempts, letter will be sent to address on file  Ardeen Fillers 03/10/2019, 9:46 AM

## 2019-03-10 NOTE — Progress Notes (Signed)
ED Antimicrobial Stewardship Positive Culture Follow Up   Felicia Foster is an 33 y.o. female who presented to [facility] on 03/07/2019 with a chief complaint of  Chief Complaint  Patient presents with  . Dysuria    Recent Results (from the past 720 hour(s))  Wet prep, genital     Status: Abnormal   Collection Time: 03/07/19  7:39 PM   Specimen: Cervical/Vaginal swab  Result Value Ref Range Status   Yeast Wet Prep HPF POC NONE SEEN NONE SEEN Final   Trich, Wet Prep NONE SEEN NONE SEEN Final   Clue Cells Wet Prep HPF POC PRESENT (A) NONE SEEN Final   WBC, Wet Prep HPF POC MODERATE (A) NONE SEEN Final   Sperm NONE SEEN  Final    Comment: Performed at [facility], [place]. [address] [place], [place] [address]  Urine culture     Status: Abnormal   Collection Time: 03/07/19  8:30 PM   Specimen: Urine, Clean Catch  Result Value Ref Range Status   Specimen Description URINE, CLEAN CATCH  Final   Special Requests   Final    NONE Performed at [facility], [place] [address] [place], [place] [address]    Culture >=100,000 COLONIES/mL STAPHYLOCOCCUS SAPROPHYTICUS (A)  Final   Report Status 03/09/2019 FINAL  Final   Organism ID, Bacteria STAPHYLOCOCCUS SAPROPHYTICUS (A)  Final      Susceptibility   Staphylococcus saprophyticus - MIC*    CIPROFLOXACIN <=0.5 SENSITIVE Sensitive     GENTAMICIN <=0.5 SENSITIVE Sensitive     NITROFURANTOIN <=16 SENSITIVE Sensitive     OXACILLIN 0.5 RESISTANT Resistant     TETRACYCLINE <=1 SENSITIVE Sensitive     VANCOMYCIN <=0.5 SENSITIVE Sensitive     TRIMETH/SULFA <=10 SENSITIVE Sensitive     CLINDAMYCIN <=0.25 SENSITIVE Sensitive     RIFAMPIN <=0.5 SENSITIVE Sensitive     Inducible Clindamycin NEGATIVE Sensitive     * >=100,000 COLONIES/mL STAPHYLOCOCCUS SAPROPHYTICUS  Novel Coronavirus, NAA (send-out to ref lab)     Status: None   Collection Time: 03/07/19 11:26 PM   Specimen: Nasopharyngeal Swab; Respiratory  Result Value Ref Range  Status   SARS-CoV-2, NAA NOT DETECTED NOT DETECTED Final        Coronavirus Source NASOPHARYNGEAL  Final    Comment: Performed at [facility], [place] [address], [place], [place] [address]    [x]  Treated with cephalexin, organism resistant to prescribed antimicrobial  New antibiotic prescription: nitrofurantoin 100 mg twice daily for 5 days. Stop cephalexin that was prescribed. Continue metronidazole as prescribed for bacterial vaginosis.    ED Provider: Eliezer Mccoy, PA-C   Thank you,   Eddie Candle, PharmD PGY-1 Pharmacy Resident  Monday - Friday phone -  [phone] Saturday - Sunday phone - [phone]

## 2019-06-16 ENCOUNTER — Telehealth: Payer: Self-pay | Admitting: Obstetrics & Gynecology

## 2019-06-16 NOTE — Telephone Encounter (Signed)
Attempted to call pt. Was not able to reach her and mailbox was full so not able to leave a message.

## 2019-06-16 NOTE — Telephone Encounter (Signed)
The patient would like a refill on medication that can be used for outbreaks. The last refill was October of last year she thinks. [facility] in [place].

## 2019-06-19 ENCOUNTER — Telehealth (INDEPENDENT_AMBULATORY_CARE_PROVIDER_SITE_OTHER): Payer: Medicare HMO | Admitting: Family Medicine

## 2019-06-19 DIAGNOSIS — A609 Anogenital herpesviral infection, unspecified: Secondary | ICD-10-CM

## 2019-06-19 DIAGNOSIS — B3731 Acute candidiasis of vulva and vagina: Secondary | ICD-10-CM

## 2019-06-19 DIAGNOSIS — B373 Candidiasis of vulva and vagina: Secondary | ICD-10-CM

## 2019-06-19 MED ORDER — VALACYCLOVIR HCL 1 G PO TABS: 1000.0000 mg | ORAL_TABLET | Freq: Two times a day (BID) | ORAL | 6 refills | Status: DC

## 2019-06-19 MED ORDER — FLUCONAZOLE 150 MG PO TABS: 150.0000 mg | ORAL_TABLET | Freq: Once | ORAL | 0 refills | Status: AC

## 2019-06-19 NOTE — Addendum Note (Signed)
Addended by: Donn Pierini on: 06/19/2019 11:12 AM   Modules accepted: Orders

## 2019-06-19 NOTE — Telephone Encounter (Signed)
Pt calls with concerns that she feels like she has an infection. 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 She reports she also has a Yeast infection.   Valtrex and Diflucan sent to Pharmacy of pts choice.   She is concerned she may have other infections. She is planning to come today for after hours clinic to have a swab done. Informed her not to take Diflucan until after swab is done. Pt voiced understanding.   Pt with no other questions or concerns at this time.

## 2019-06-19 NOTE — Telephone Encounter (Signed)
Patient called in stating that she needs to make an appointment. Patient stated that she already has an appointment for 12/3 but she cannot wait that long because she is in a lot of pain. Patient instructed unfortunately that we do not have a sooner appointment for her but we do have walk-in clinics on Mondays that she can attend to to get everything addressed. Patient stated that she thinks she has some type of infection. Patient wanted to know if she can have a refill on the medication. Patient instructed that I would send a message to the nurses and they will get back with her. Patient stated that she has called about it on both Thursday and Friday already. Patient instructed that the nurses are short staffed so it may take longer than normal. Patient verbalized understanding. Message sent to clinical pool.

## 2019-06-26 ENCOUNTER — Other Ambulatory Visit (HOSPITAL_COMMUNITY)
Admission: RE | Admit: 2019-06-26 | Discharge: 2019-06-26 | Disposition: A | Discharge status: Home / Self Care | Payer: Medicare HMO | Source: Ambulatory Visit | Attending: Family Medicine | Admitting: Family Medicine

## 2019-06-26 ENCOUNTER — Ambulatory Visit (INDEPENDENT_AMBULATORY_CARE_PROVIDER_SITE_OTHER): Payer: Medicare HMO

## 2019-06-26 ENCOUNTER — Other Ambulatory Visit: Payer: Self-pay

## 2019-06-26 DIAGNOSIS — Z113 Encounter for screening for infections with a predominantly sexual mode of transmission: Secondary | ICD-10-CM | POA: Insufficient documentation

## 2019-06-27 NOTE — Progress Notes (Signed)
Pt here today for screening for STD's.  Pt reports that she is just wanting to be tested to make sure she is okay. Pt explained how to obtain self swab and that we will call with abnormal results in 24-48 hrs.  Pt verbalized understanding.    Mel Almond, RN 06/27/19

## 2019-06-28 LAB — CERVICOVAGINAL ANCILLARY ONLY
Bacterial Vaginitis (gardnerella): POSITIVE — AB
Candida Glabrata: NEGATIVE
Candida Vaginitis: NEGATIVE
Chlamydia: NEGATIVE
Comment: NEGATIVE
Comment: NEGATIVE
Comment: NEGATIVE
Comment: NEGATIVE
Comment: NEGATIVE
Comment: NORMAL
Neisseria Gonorrhea: NEGATIVE
Trichomonas: NEGATIVE

## 2019-06-29 ENCOUNTER — Other Ambulatory Visit: Payer: Self-pay | Admitting: Family Medicine

## 2019-06-29 MED ORDER — METRONIDAZOLE 500 MG PO TABS: 500.0000 mg | ORAL_TABLET | Freq: Two times a day (BID) | ORAL | 0 refills | Status: AC

## 2019-06-29 NOTE — Progress Notes (Signed)
Chart reviewed for nurse visit. Agree with plan of care.   Clarnce Flock, MD

## 2019-07-06 ENCOUNTER — Ambulatory Visit: Payer: Medicare HMO | Admitting: Obstetrics and Gynecology

## 2019-07-06 ENCOUNTER — Encounter: Payer: Self-pay | Admitting: Obstetrics and Gynecology

## 2019-10-03 IMAGING — CR DG CHEST 2V
2 series · 2 of 2 positions shown · non-contrast
Comparison: 06/20/2018

CLINICAL DATA: Shortness of breath

EXAM:
CHEST - 2 VIEW

[chest pa]
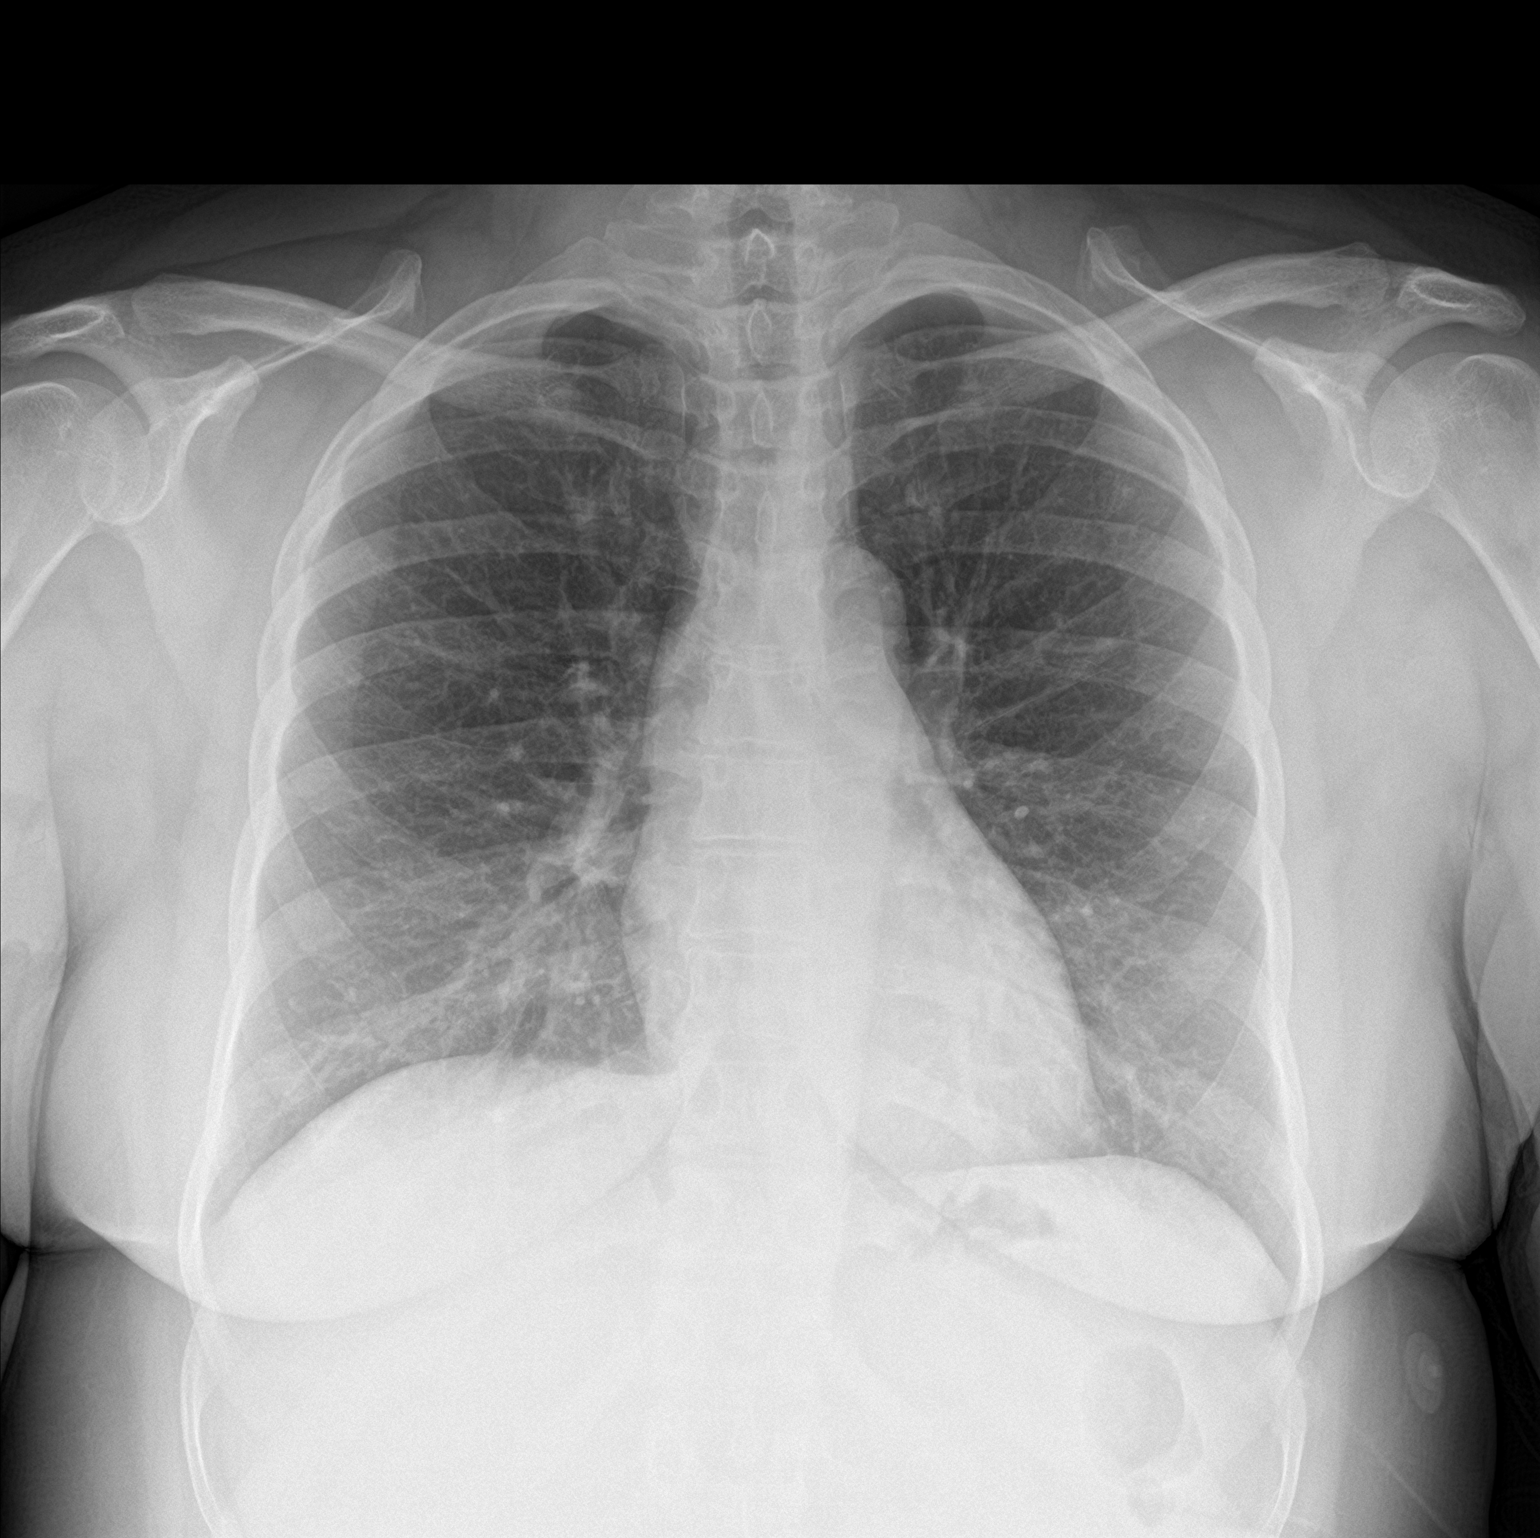

[chest lat]
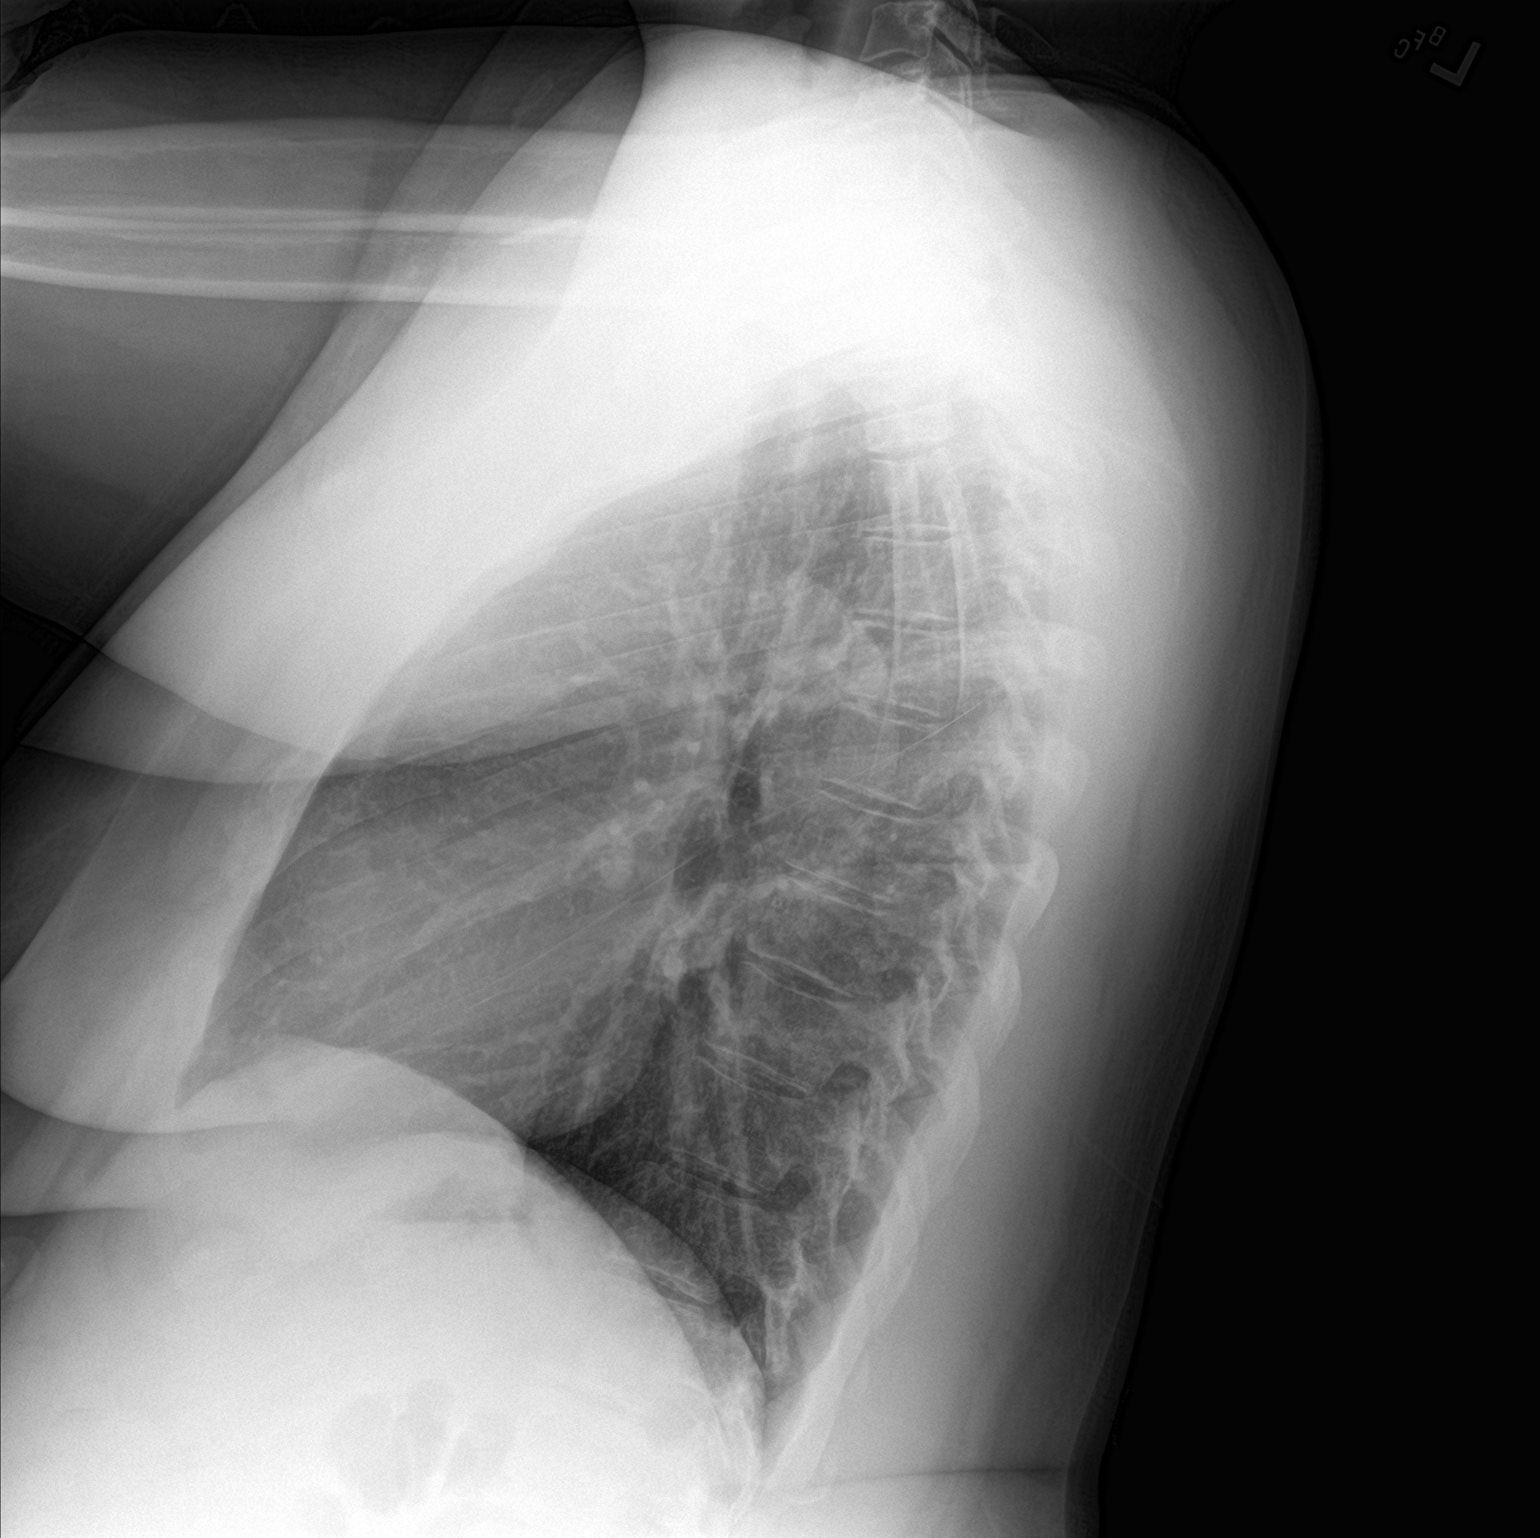

[2 of 2 positions shown; findings below may reference images not displayed]

FINDINGS: The heart size and mediastinal contours are within normal limits.
Both lungs are clear. The visualized skeletal structures are
unremarkable.
IMPRESSION: No active cardiopulmonary disease.

## 2019-12-28 ENCOUNTER — Emergency Department (HOSPITAL_COMMUNITY)
Admission: EM | Admit: 2019-12-28 | Discharge: 2019-12-29 | Discharge status: Against Medical Advice | Payer: Medicare HMO | Attending: Emergency Medicine | Admitting: Emergency Medicine

## 2019-12-28 ENCOUNTER — Other Ambulatory Visit: Payer: Self-pay

## 2019-12-28 ENCOUNTER — Encounter (HOSPITAL_COMMUNITY): Payer: Self-pay | Admitting: Emergency Medicine

## 2019-12-28 DIAGNOSIS — N939 Abnormal uterine and vaginal bleeding, unspecified: Secondary | ICD-10-CM | POA: Diagnosis present

## 2019-12-28 DIAGNOSIS — Z5321 Procedure and treatment not carried out due to patient leaving prior to being seen by health care provider: Secondary | ICD-10-CM | POA: Diagnosis not present

## 2019-12-28 LAB — BASIC METABOLIC PANEL
Anion gap: 5 (ref 5–15)
BUN: 12 mg/dL (ref 6–20)
CO2: 29 mmol/L (ref 22–32)
Calcium: 8.8 mg/dL — ABNORMAL LOW (ref 8.9–10.3)
Chloride: 108 mmol/L (ref 98–111)
Creatinine, Ser: 1.28 mg/dL — ABNORMAL HIGH (ref 0.44–1.00)
GFR calc Af Amer: >60 mL/min (ref >60)
GFR calc non Af Amer: 54 mL/min — ABNORMAL LOW (ref >60)
Glucose, Bld: 89 mg/dL (ref 70–99)
Potassium: 4.3 mmol/L (ref 3.5–5.1)
Sodium: 142 mmol/L (ref 135–145)

## 2019-12-28 LAB — CBC
HCT: 40.9 % (ref 36.0–46.0)
Hemoglobin: 12.4 g/dL (ref 12.0–15.0)
MCH: 25.6 pg — ABNORMAL LOW (ref 26.0–34.0)
MCHC: 30.3 g/dL (ref 30.0–36.0)
MCV: 84.3 fL (ref 80.0–100.0)
Platelets: 251 10*3/uL (ref 150–400)
RBC: 4.85 MIL/uL (ref 3.87–5.11)
RDW: 14.6 % (ref 11.5–15.5)
WBC: 5.9 10*3/uL (ref 4.0–10.5)
nRBC: 0 % (ref 0.0–0.2)

## 2019-12-28 LAB — POC URINE PREG, ED: Preg Test, Ur: NEGATIVE

## 2019-12-28 NOTE — ED Triage Notes (Signed)
Patient arrives to ED with complaints of vaginal bleeding and lower abdominal cramping starting today. Patient states last period was last week in February 2021. Patient states took home pregnancy test last month but has not followed up with OB.

## 2019-12-29 NOTE — ED Notes (Signed)
Called pts name 3x for updated VS with no response.  

## 2020-01-02 ENCOUNTER — Other Ambulatory Visit: Payer: Self-pay

## 2020-01-02 ENCOUNTER — Encounter (HOSPITAL_COMMUNITY): Payer: Self-pay | Admitting: Psychiatry

## 2020-01-02 ENCOUNTER — Inpatient Hospital Stay (HOSPITAL_COMMUNITY)
Admission: AD | Admit: 2020-01-02 | Discharge: 2020-01-05 | DRG: 885 | Disposition: A | Discharge status: Home / Self Care | Payer: Medicare HMO | Attending: Psychiatry | Admitting: Psychiatry

## 2020-01-02 DIAGNOSIS — F17210 Nicotine dependence, cigarettes, uncomplicated: Secondary | ICD-10-CM | POA: Diagnosis present

## 2020-01-02 DIAGNOSIS — F3130 Bipolar disorder, current episode depressed, mild or moderate severity, unspecified: Principal | ICD-10-CM | POA: Diagnosis present

## 2020-01-02 DIAGNOSIS — Z8632 Personal history of gestational diabetes: Secondary | ICD-10-CM | POA: Diagnosis not present

## 2020-01-02 DIAGNOSIS — I10 Essential (primary) hypertension: Secondary | ICD-10-CM | POA: Diagnosis present

## 2020-01-02 DIAGNOSIS — R45851 Suicidal ideations: Secondary | ICD-10-CM | POA: Diagnosis present

## 2020-01-02 DIAGNOSIS — Z915 Personal history of self-harm: Secondary | ICD-10-CM

## 2020-01-02 DIAGNOSIS — Z20822 Contact with and (suspected) exposure to covid-19: Secondary | ICD-10-CM | POA: Diagnosis present

## 2020-01-02 LAB — SARS CORONAVIRUS 2 BY RT PCR (HOSPITAL ORDER, PERFORMED IN ~~LOC~~ HOSPITAL LAB): SARS Coronavirus 2: NEGATIVE

## 2020-01-02 MED ORDER — MAGNESIUM HYDROXIDE 400 MG/5ML PO SUSP: 30.0000 mL | Freq: Every day | ORAL | Status: DC | PRN

## 2020-01-02 MED ORDER — THIAMINE HCL 100 MG PO TABS
100.0000 mg | ORAL_TABLET | Freq: Every day | ORAL | Status: DC
  Administered 2020-01-03 – 2020-01-05 (×3): 100 mg via ORAL
  Filled 2020-01-02 (×4): qty 1

## 2020-01-02 MED ORDER — ARIPIPRAZOLE 10 MG PO TABS
10.0000 mg | ORAL_TABLET | Freq: Every day | ORAL | Status: DC
  Administered 2020-01-02 – 2020-01-04 (×3): 10 mg via ORAL
  Filled 2020-01-02 (×5): qty 1

## 2020-01-02 MED ORDER — AMLODIPINE BESYLATE 10 MG PO TABS: 10.0000 mg | ORAL_TABLET | Freq: Every day | ORAL | Status: DC

## 2020-01-02 MED ORDER — LISINOPRIL 10 MG PO TABS
10.0000 mg | ORAL_TABLET | Freq: Every day | ORAL | Status: DC
  Administered 2020-01-02 – 2020-01-05 (×4): 10 mg via ORAL
  Filled 2020-01-02 (×6): qty 1

## 2020-01-02 MED ORDER — THIAMINE HCL 100 MG/ML IJ SOLN: 100.0000 mg | Freq: Once | INTRAMUSCULAR | Status: DC

## 2020-01-02 MED ORDER — LOPERAMIDE HCL 2 MG PO CAPS: 2.0000 mg | ORAL_CAPSULE | ORAL | Status: DC | PRN

## 2020-01-02 MED ORDER — ADULT MULTIVITAMIN W/MINERALS CH: 1.0000 | ORAL_TABLET | Freq: Every day | ORAL | Status: DC

## 2020-01-02 MED ORDER — LORAZEPAM 1 MG PO TABS: 1.0000 mg | ORAL_TABLET | Freq: Four times a day (QID) | ORAL | Status: DC | PRN

## 2020-01-02 MED ORDER — BUPROPION HCL ER (XL) 150 MG PO TB24
150.0000 mg | ORAL_TABLET | Freq: Every day | ORAL | Status: DC
  Administered 2020-01-02 – 2020-01-04 (×3): 150 mg via ORAL
  Filled 2020-01-02 (×5): qty 1

## 2020-01-02 MED ORDER — TRAZODONE HCL 50 MG PO TABS
50.0000 mg | ORAL_TABLET | Freq: Every evening | ORAL | Status: DC | PRN
  Administered 2020-01-04: 50 mg via ORAL
  Filled 2020-01-02: qty 1

## 2020-01-02 MED ORDER — ONDANSETRON 4 MG PO TBDP: 4.0000 mg | ORAL_TABLET | Freq: Four times a day (QID) | ORAL | Status: DC | PRN

## 2020-01-02 MED ORDER — ADULT MULTIVITAMIN W/MINERALS CH
1.0000 | ORAL_TABLET | Freq: Every day | ORAL | Status: DC
  Administered 2020-01-02 – 2020-01-05 (×4): 1 via ORAL
  Filled 2020-01-02 (×6): qty 1

## 2020-01-02 MED ORDER — NICOTINE 21 MG/24HR TD PT24
21.0000 mg | MEDICATED_PATCH | Freq: Every day | TRANSDERMAL | Status: DC
  Administered 2020-01-02 – 2020-01-04 (×2): 21 mg via TRANSDERMAL
  Filled 2020-01-02 (×6): qty 1

## 2020-01-02 MED ORDER — HYDROXYZINE HCL 25 MG PO TABS
25.0000 mg | ORAL_TABLET | Freq: Four times a day (QID) | ORAL | Status: DC | PRN
  Administered 2020-01-04: 25 mg via ORAL
  Filled 2020-01-02: qty 1

## 2020-01-02 MED ORDER — ACETAMINOPHEN 325 MG PO TABS: 650.0000 mg | ORAL_TABLET | Freq: Four times a day (QID) | ORAL | Status: DC | PRN

## 2020-01-02 MED ORDER — ALUM & MAG HYDROXIDE-SIMETH 200-200-20 MG/5ML PO SUSP: 30.0000 mL | ORAL | Status: DC | PRN

## 2020-01-02 NOTE — Progress Notes (Signed)
Patient escorted to inpatient unit on #300 hall (302-2). RN gave report to [place], Therapist, sports, Radiographer, therapeutic. Pt denies concerns or discomfort at time of transport. Safety maintained.
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
## 2020-01-02 NOTE — Plan of Care (Signed)
[facility]ation Crisis Plan  Reason for Crisis Plan:  Crisis Stabilization   Plan of Care:  Referral for Inpatient Hospitalization  Family Support:      Current Living Environment:  Living Arrangements: Children  Insurance:   Hospital Account    Name Acct ID Class Status Primary Coverage   Felicia Foster, Felicia Foster [id number] [place]        Guarantor Account (for Hospital Account [id number])    Name Relation to Pt Service Area Active? Acct Type   Felicia Foster, Felicia Foster Self [facility] Yes Behavioral Health   Address Phone       [address] [place], [place] [address] [phone])          Coverage Information (for Hospital Account [id number])    F/O Payor/Plan Precert #   [facility] #   Felicia Foster, Felicia Foster [id number]   Address [place]   [address] [place] [address] [phone]      Legal Guardian:  Legal Guardian: (no legal guardian )  Primary Care Provider:  Patient, No Pcp Per  Current Outpatient Providers:  unknown  Psychiatrist:  Name of Psychiatrist: (no psychiatrist )  Counselor/Therapist:  Name of Therapist: (no therapist )  Compliant with Medications:  No  Additional Information:   Felicia Foster 6/1/20214:53 PM
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
## 2020-01-02 NOTE — BHH Suicide Risk Assessment (Signed)
[facility] Admission Suicide Risk Assessment   Nursing information obtained from:    Demographic factors:    Current Mental Status:    Loss Factors:    Historical Factors:    Risk Reduction Factors:     Total Time spent with patient: 15 minutes Principal Problem: <principal problem not specified> Diagnosis:  Active Problems:   Bipolar I disorder, most recent episode depressed (HCC)  Subjective Data: Patient is seen and examined.  Patient is a 34 year old female with a past psychiatric history significant for posttraumatic stress disorder, bipolar disorder, cocaine dependence who presented to the behavioral health hospital with worsening depression.  She stated that she had recently had some friends who had overdosed on drugs and 1 that had died of the overdose.  She began to develop suicidal ideation and think about driving her car off the road.  She was being followed at the [facility] for substance issues, but had not been back to see them in quite a while.  She also admitted that she been off her psychiatric medications for some time.  This included Abilify and Wellbutrin.  She stated that she had been drinking alcohol, but denied other drugs.  She was depressed, tearful and irritable.  It was decided she should be admitted to the hospital for evaluation and stabilization.  Continued Clinical Symptoms:    The "Alcohol Use Disorders Identification Test", Guidelines for Use in Primary Care, Second Edition.  World Pharmacologist [facility]). Score between 0-7:  no or low risk or alcohol related problems. Score between 8-15:  moderate risk of alcohol related problems. Score between 16-19:  high risk of alcohol related problems. Score 20 or above:  warrants further diagnostic evaluation for alcohol dependence and treatment.   CLINICAL FACTORS:   Bipolar Disorder:   Depressive phase Alcohol/Substance Abuse/Dependencies More than one psychiatric diagnosis Previous Psychiatric Diagnoses and  Treatments   Musculoskeletal: Strength & Muscle Tone: within normal limits Gait & Station: normal Patient leans: N/A  Psychiatric Specialty Exam: Physical Exam  Nursing note and vitals reviewed. Constitutional: She appears well-developed and well-nourished.  HENT:  Head: Normocephalic and atraumatic.  Respiratory: Effort normal.  Neurological: She is alert.    Review of Systems  Blood pressure (!) 155/97, pulse (!) 107, temperature 98.2 F (36.8 C), temperature source Oral, resp. rate 18, SpO2 99 %.There is no height or weight on file to calculate BMI.  General Appearance: Disheveled  Eye Contact:  Fair  Speech:  Normal Rate  Volume:  Decreased  Mood:  Anxious, Depressed and Dysphoric  Affect:  Congruent  Thought Process:  Coherent and Descriptions of Associations: Intact  Orientation:  Full (Time, Place, and Person)  Thought Content:  Logical  Suicidal Thoughts:  Yes.  with intent/plan  Homicidal Thoughts:  No  Memory:  Immediate;   Fair Recent;   Fair Remote;   Fair  Judgement:  Intact  Insight:  Fair  Psychomotor Activity:  Increased  Concentration:  Concentration: Fair and Attention Span: Fair  Recall:  [employer] of Knowledge:  Fair  Language:  Fair  Akathisia:  Negative  Handed:  Right  AIMS (if indicated):     Assets:  Desire for Improvement Resilience  ADL's:  Intact  Cognition:  WNL  Sleep:         COGNITIVE FEATURES THAT CONTRIBUTE TO RISK:  None    SUICIDE RISK:   Moderate:  Frequent suicidal ideation with limited intensity, and duration, some specificity in terms of plans, no associated  intent, good self-control, limited dysphoria/symptomatology, some risk factors present, and identifiable protective factors, including available and accessible social support.  PLAN OF CARE: Patient is seen and examined.  Patient is a 34 year old female with the above-stated past psychiatric history is admitted secondary to suicidal ideation and worsening  depression.  She will be admitted to the hospital.  She will be integrated in the milieu.  She will be encouraged to attend groups.  Given the previous success of the Wellbutrin and Abilify.  They will be restarted.  She will be restarted Abilify 10 mg p.o. daily and Wellbutrin XL 150 mg p.o. daily.  She will also have lisinopril restarted 10 mg p.o. daily.  We will hold off on the amlodipine for now.  She will also be placed on lorazepam 1 mg p.o. every 6 hours as needed a CIWA greater than 10.  She will also be given folic acid and thiamine.  The only labs that we have back currently are her electrolytes with a mildly elevated creatinine at 1.20 from 5/27.  Her CBC from 5/27 was also essentially normal.  Pregnancy test was negative.  I certify that inpatient services furnished can reasonably be expected to improve the patient's condition.   Sharma Covert, MD 01/02/2020, 3:31 PM

## 2020-01-02 NOTE — Progress Notes (Signed)
Patient laying in bed resting with eyes closed.  Did not want to talk.  Respirations even and unlabored.  No signs/symptoms of pain/distress noted on patient's face/body movements.  Safety maintained with 15 minute checks.

## 2020-01-02 NOTE — Progress Notes (Signed)
34 yo AA female presented tearful stating, "I need some help. I don't want to live anymore. I haven't taken my meds in a long time and now everything is getting overwhelming. I can't take it anymore. I have Bipolar and it's out of control. BP elevated to 161/115 sitting, 158/116 standing. NP notified. Rechecked approx. 30 min later, 155/97. Patient states, "I haven't taken my BP medicine in awhile either. Sometimes I get blurry vision. But not now. Bouts of irritation when asking questions, then apolgetic and tearful. Admits to using cocaine and drinking ETOH frequently. Patient stated, "I just want to run my car off the road and see what happens. I knew I needed some help. I don't feel safe. I might do something and I have kids to look after". Patient to be admitted to [facility] unit pending (-) COVID results. Q15 minute observation rounds initiated per facility protocol.

## 2020-01-03 DIAGNOSIS — F3130 Bipolar disorder, current episode depressed, mild or moderate severity, unspecified: Principal | ICD-10-CM
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
## 2020-01-03 NOTE — Tx Team (Signed)
Interdisciplinary Treatment and Diagnostic Plan Update  01/03/2020 Time of Session: 9:30am Felicia Foster MRN: [id number]  Principal Diagnosis: <principal problem not specified>  Secondary Diagnoses: Active Problems:   Bipolar I disorder, most recent episode depressed ([place])   Current Medications:  Current Facility-Administered Medications  Medication Dose Route Frequency Provider Last Rate Last Admin  . acetaminophen (TYLENOL) tablet 650 mg  650 mg Oral Q6H PRN Money, Lowry Ram, FNP      . alum & mag hydroxide-simeth (MAALOX/MYLANTA) 200-200-20 MG/5ML suspension 30 mL  30 mL Oral Q4H PRN Money, Lowry Ram, FNP      . ARIPiprazole (ABILIFY) tablet 10 mg  10 mg Oral Daily Money, Lowry Ram, FNP   10 mg at 01/03/20 0926  . buPROPion (WELLBUTRIN XL) 24 hr tablet 150 mg  150 mg Oral Daily Money, Lowry Ram, FNP   150 mg at 01/03/20 5277  . hydrOXYzine (ATARAX/VISTARIL) tablet 25 mg  25 mg Oral Q6H PRN Money, Lowry Ram, FNP      . lisinopril (ZESTRIL) tablet 10 mg  10 mg Oral Daily Money, Lowry Ram, FNP   10 mg at 01/03/20 0942  . loperamide (IMODIUM) capsule 2-4 mg  2-4 mg Oral PRN Money, Lowry Ram, FNP      . LORazepam (ATIVAN) tablet 1 mg  1 mg Oral Q6H PRN Money, Lowry Ram, FNP      . magnesium hydroxide (MILK OF MAGNESIA) suspension 30 mL  30 mL Oral Daily PRN Money, Lowry Ram, FNP      . multivitamin with minerals tablet 1 tablet  1 tablet Oral Daily Money, Lowry Ram, FNP   1 tablet at 01/03/20 8242  . nicotine (NICODERM CQ - dosed in mg/24 hours) patch 21 mg  21 mg Transdermal Daily Money, Darnelle Maffucci B, FNP   21 mg at 01/02/20 1600  . ondansetron (ZOFRAN-ODT) disintegrating tablet 4 mg  4 mg Oral Q6H PRN Money, Lowry Ram, FNP      . thiamine (B-1) injection 100 mg  100 mg Intramuscular Once Money, Darnelle Maffucci B, FNP      . thiamine tablet 100 mg  100 mg Oral Daily Money, Lowry Ram, FNP   100 mg at 01/03/20 3536  . traZODone (DESYREL) tablet 50 mg  50 mg Oral QHS PRN Money, Lowry Ram, FNP       PTA  Medications: No medications prior to admission.    Patient Stressors:    Patient Strengths:    Treatment Modalities: Medication Management, Group therapy, Case management,  1 to 1 session with clinician, Psychoeducation, Recreational therapy.   Physician Treatment Plan for Primary Diagnosis: <principal problem not specified> Long Term Goal(s):     Short Term Goals:    Medication Management: Evaluate patient's response, side effects, and tolerance of medication regimen.  Therapeutic Interventions: 1 to 1 sessions, Unit Group sessions and Medication administration.  Evaluation of Outcomes: Not Met  Physician Treatment Plan for Secondary Diagnosis: Active Problems:   Bipolar I disorder, most recent episode depressed ([place])  Long Term Goal(s):     Short Term Goals:       Medication Management: Evaluate patient's response, side effects, and tolerance of medication regimen.  Therapeutic Interventions: 1 to 1 sessions, Unit Group sessions and Medication administration.  Evaluation of Outcomes: Not Met   RN Treatment Plan for Primary Diagnosis: <principal problem not specified> Long Term Goal(s): Knowledge of disease and therapeutic regimen to maintain health will improve  Short Term Goals: Ability to remain free from injury  will improve, Ability to verbalize frustration and anger appropriately will improve, Ability to identify and develop effective coping behaviors will improve and Compliance with prescribed medications will improve  Medication Management: RN will administer medications as ordered by provider, will assess and evaluate patient's response and provide education to patient for prescribed medication. RN will report any adverse and/or side effects to prescribing provider.  Therapeutic Interventions: 1 on 1 counseling sessions, Psychoeducation, Medication administration, Evaluate responses to treatment, Monitor vital signs and CBGs as ordered, Perform/monitor CIWA, COWS,  AIMS and Fall Risk screenings as ordered, Perform wound care treatments as ordered.  Evaluation of Outcomes: Not Met   LCSW Treatment Plan for Primary Diagnosis: <principal problem not specified> Long Term Goal(s): Safe transition to appropriate next level of care at discharge, Engage patient in therapeutic group addressing interpersonal concerns.  Short Term Goals: Engage patient in aftercare planning with referrals and resources, Increase social support, Increase ability to appropriately verbalize feelings, Identify triggers associated with mental health/substance abuse issues and Increase skills for wellness and recovery  Therapeutic Interventions: Assess for all discharge needs, 1 to 1 time with Social worker, Explore available resources and support systems, Assess for adequacy in community support network, Educate family and significant other(s) on suicide prevention, Complete Psychosocial Assessment, Interpersonal group therapy.  Evaluation of Outcomes: Not Met   Progress in Treatment: Attending groups: No. Participating in groups: No. Taking medication as prescribed: Yes. Toleration medication: Yes. Family/Significant other contact made: No, will contact:  once consent is received.  Patient understands diagnosis: Yes. Discussing patient identified problems/goals with staff: No. Medical problems stabilized or resolved: No. Denies suicidal/homicidal ideation: Yes. Issues/concerns per patient self-inventory: No. Other: none.  New problem(s) identified: No, Describe:  CSW will continue to assess.  New Short Term/Long Term Goal(s): medication management for mood stabilization; elimination of SI thoughts; development of comprehensive mental wellness/sobriety plan  Patient Goals:  Patient did not attend.  Discharge Plan or Barriers: Patient recently admitted. CSW will continue to follow and assess for appropriate referrals and possible discharge planning.   Reason for Continuation  of Hospitalization: Anxiety Depression Medication stabilization  Estimated Length of Stay: 3-5 days  Attendees: Patient: Did not attend 01/03/2020   Physician:  01/03/2020  Nursing:  01/03/2020  RN Care Manager: 01/03/2020  Social Worker: Darletta Moll, [place] 01/03/2020   Recreational Therapist:  01/03/2020  Other: Marvia Pickles, [place] 01/03/2020   Other:  01/03/2020   Other: 01/03/2020      Scribe for Treatment Team: Vassie Moselle, [place] 01/03/2020 9:49 AM

## 2020-01-03 NOTE — Progress Notes (Signed)
   01/03/20 1123  Vital Signs  Temp 97.8 F (36.6 C)  Temp Source Oral  Pulse Rate 78  Pulse Rate Source Monitor  Resp 20  BP (!) 161/102  BP Location Left Arm  BP Method Automatic  Patient Position (if appropriate) Lying  Oxygen Therapy  SpO2 100 %  D:  Patient isolated in her room and refused to get up for meds. Patient took meds that were brought to her room. Patient refused to get up for meals.  Patient stated that she was "crabby" and didn't want to talk.  A:  Patient took scheduled medicine.  Support and encouragement provided Routine safety checks conducted every 15 minutes. Patient  Informed to notify staff with any concerns.   R:Safety maintained.

## 2020-01-03 NOTE — Progress Notes (Signed)
DAR NOTE: Pt present with flat affect and depressed mood in the unit. Pt has been isolating herself in the room. Pt denies physical pain. Pt's safety ensured with 15 minute and environmental checks. Pt currently denies SI/HI and A/V hallucinations. Pt verbally agrees to seek staff if SI/HI or A/VH occurs and to consult with staff before acting on these thoughts. Will continue POC.

## 2020-01-03 NOTE — B[facility] Group Notes (Signed)
Type of Therapy/Topic: Identifying Irrational Beliefs/Thoughts  Participation Level: None  Description of Group: The purpose of this group is to assist patients in learning to identify irrational beliefs and thoughts that contribute to their negative emotions and experience positive emotions. Patients will be guided to discuss ways in which they have been effected by irrational thoughts and beliefs and how to transform those irrational beliefs into rational ones. Newly identified rational beliefs will be juxtaposed with experiences of positive emotions or situations, and patients will be challenged to use rational beliefs or thoughts to combat negative ones. Special emphasis will be placed on coping with irrational beliefs in conflict situations, and patients will process healthy conflict resolution skills.  Therapeutic Goals: 1. Patient will identify two irrational thoughts or beliefs  to reflect on in order to balance out those thoughts 2. Patient will label two or more irrational thoughts/beliefs that they find the most difficult to cope with 3. Patient will demonstrate positive conflict resolution skills through discussion and/or role plays that will assist in transforming irrational thoughts or beliefs into positive ones.  Summary of Patient Progress:  Packet was reviewed with patients. Patients were instructed to complete work-sheet and to present any questions or concerns.   Therapeutic Modalities: Cognitive Behavioral Therapy Feelings Identification Dialectical Behavioral Therapy

## 2020-01-04 MED ORDER — ARIPIPRAZOLE 10 MG PO TABS
10.0000 mg | ORAL_TABLET | Freq: Every day | ORAL | Status: DC
  Administered 2020-01-04: 10 mg via ORAL
  Filled 2020-01-04 (×2): qty 1

## 2020-01-04 MED ORDER — BUPROPION HCL ER (XL) 300 MG PO TB24
300.0000 mg | ORAL_TABLET | Freq: Every day | ORAL | Status: DC
  Administered 2020-01-05: 300 mg via ORAL
  Filled 2020-01-04 (×2): qty 1

## 2020-01-04 MED ORDER — AMLODIPINE BESYLATE 5 MG PO TABS
5.0000 mg | ORAL_TABLET | Freq: Every day | ORAL | Status: DC
  Administered 2020-01-04 – 2020-01-05 (×2): 5 mg via ORAL
  Filled 2020-01-04 (×4): qty 1

## 2020-01-04 MED ORDER — AMLODIPINE BESYLATE 10 MG PO TABS: 10.0000 mg | ORAL_TABLET | Freq: Every day | ORAL | Status: DC

## 2020-01-04 NOTE — Progress Notes (Signed)
   01/03/20 2000  Psych Admission Type (Psych Patients Only)  Admission Status Voluntary  Psychosocial Assessment  Patient Complaints None  Eye Contact Brief  Facial Expression Flat  Affect Depressed;Flat  Speech Logical/coherent  Interaction Isolative;Minimal  Motor Activity Fidgety  Appearance/Hygiene In hospital gown  Behavior Characteristics Appropriate to situation;Guarded  Mood Depressed;Anxious  Thought Process  Coherency WDL  Content WDL  Delusions None reported or observed  Perception WDL  Hallucination None reported or observed  Judgment Impaired  Confusion None  Danger to Self  Current suicidal ideation? Denies  Self-Injurious Behavior No self-injurious ideation or behavior indicators observed or expressed   Agreement Not to Harm Self Yes  Description of Agreement verbally contracts for safety  Danger to Others  Danger to Others None reported or observed  Approached patient in her room to introduce myself, she turned over and went back to sleep.  She came out some time later to use the phone and then approached nurses station reporting that she slept through dinner and had previously asked for a meal tray.  Pt. Provided with meal tray after which she returned to her room.  This RN went back to the patients room to speak with her, she was flat and forwarded little.  She denies SI/HI/AVH and nodded her head when asked if she would approach staff if she developed any thoughts.

## 2020-01-04 NOTE — BHH Counselor (Signed)
CSW attempted to complete PSA with patient and discuss follow up referrals. CSW knocked at the door and entered room. After calling patient's name several times and introducing self and purpose of visit patient stated "I don't want to talk to anyone, ever" and pulled blankets over her head.  CSW attempted to meet with patient earlier this date without success.   Stephanie Acre, MSW, LCSW-A Clinical Social Worker

## 2020-01-04 NOTE — Progress Notes (Signed)
[facility] MD Progress Note  01/04/2020 10:00 AM Felicia Foster  MRN:  [id number]   Subjective: Follow-up for this 34 year old female diagnosed with bipolar 1 disorder most recent episode depressed.  Patient continues to report feeling very depressed and continued suicidal thoughts but gives no plan or intent.  Patient states that she does not want to come out of her room because she does not want to have to deal with people.  Patient states that she is not sure what she needs to do and suggest the possibility of having her medications changed.  Patient is encouraged to come out of her room and go to the day room more often and patient states that she just does not feel like doing that.  She denies any homicidal ideations and denies any hallucinations.  Patient has stated that she will take her medications as prescribed.  Principal Problem: Bipolar I disorder, most recent episode depressed ([place]) Diagnosis: Principal Problem:   Bipolar I disorder, most recent episode depressed ([place])  Total Time spent with patient: 30 minutes  Past Psychiatric History: Patient presents with multiple hospitalizations with the last being documented as February 2028 [facility].  Patient reports numerous suicide attempts of various causes including overdose.  Patient was reporting active with [facility] for outpatient follow-up  Past Medical History:  Past Medical History:  Diagnosis Date   Diabetes mellitus without complication ([place])    Fibroids    Gestational diabetes    Hypertension    Mental disorder    bipolar    Past Surgical History:  Procedure Laterality Date   CESAREAN SECTION     x 4   CESAREAN SECTION N/A 08/08/2014   Procedure: CESAREAN SECTION;  Surgeon: Truett Mainland, DO;  Location: [place] [facility];  Service: Obstetrics;  Laterality: N/A;  repeat   DILATION AND CURETTAGE OF UTERUS     Family History:  Family History  Problem Relation Age of Onset   Cancer Mother    Family Psychiatric  History: None  reported Social History:  Social History   Substance and Sexual Activity  Alcohol Use Yes   Alcohol/week: 10.0 - 12.0 standard drinks   Types: 10 - 12 Cans of beer per week   Comment: Pt admits drinking daily     Social History   Substance and Sexual Activity  Drug Use Yes   Types: Cocaine    Social History   Socioeconomic History   Marital status: Single    Spouse name: Not on file   Number of children: Not on file   Years of education: Not on file   Highest education level: Not on file  Occupational History   Not on file  Tobacco Use   Smoking status: Current Some Day Smoker    Packs/day: 0.50    Types: Cigarettes   Smokeless tobacco: Never Used   Tobacco comment: Pt declined  Substance and Sexual Activity   Alcohol use: Yes    Alcohol/week: 10.0 - 12.0 standard drinks    Types: 10 - 12 Cans of beer per week    Comment: Pt admits drinking daily   Drug use: Yes    Types: Cocaine   Sexual activity: Yes    Birth control/protection: None  Other Topics Concern   Not on file  Social History Narrative   ** Merged History Encounter **       Social Determinants of Health   Financial Resource Strain:    Difficulty of Paying Living Expenses:   Food  Insecurity:    Worried About Charity fundraiser in the Last Year:    Arboriculturist in the Last Year:   Transportation Needs:    Film/video editor (Medical):    Lack of Transportation (Non-Medical):   Physical Activity:    Days of Exercise per Week:    Minutes of Exercise per Session:   Stress:    Feeling of Stress :   Social Connections:    Frequency of Communication with Friends and Family:    Frequency of Social Gatherings with Friends and Family:    Attends Religious Services:    Active Member of Clubs or Organizations:    Attends Archivist Meetings:    Marital Status:    Additional Social History:    Pain Medications: denies Prescriptions: denies Over the  Counter: denies History of alcohol / drug use?: Yes Longest period of sobriety (when/how long): Unknown Negative Consequences of Use: Financial, Personal relationships Name of Substance 1: Alcohol 1 - Age of First Use: unk 1 - Amount (size/oz): binges 1 - Frequency: intermittent binges 1 - Duration: on-going 1 - Last Use / Amount: 01/01/2020; (#2) 40 ounce beers                  Sleep: Good  Appetite:  Fair  Current Medications: Current Facility-Administered Medications  Medication Dose Route Frequency Provider Last Rate Last Admin   acetaminophen (TYLENOL) tablet 650 mg  650 mg Oral Q6H PRN Delno Blaisdell, Lowry Ram, FNP       alum & mag hydroxide-simeth (MAALOX/MYLANTA) 200-200-20 MG/5ML suspension 30 mL  30 mL Oral Q4H PRN Derrica Sieg, Lowry Ram, FNP       amLODipine (NORVASC) tablet 5 mg  5 mg Oral Daily Jaasia Viglione, Lowry Ram, FNP   5 mg at 01/04/20 0825   ARIPiprazole (ABILIFY) tablet 10 mg  10 mg Oral QHS Sloane Palmer, Lowry Ram, FNP       [START ON 01/05/2020] buPROPion (WELLBUTRIN XL) 24 hr tablet 300 mg  300 mg Oral Daily Sanjna Haskew, Darnelle Maffucci B, FNP       hydrOXYzine (ATARAX/VISTARIL) tablet 25 mg  25 mg Oral Q6H PRN Fawne Hughley, Lowry Ram, FNP       lisinopril (ZESTRIL) tablet 10 mg  10 mg Oral Daily Miranda Garber, Lowry Ram, FNP   10 mg at 01/04/20 0825   loperamide (IMODIUM) capsule 2-4 mg  2-4 mg Oral PRN Niemah Schwebke, Lowry Ram, FNP       LORazepam (ATIVAN) tablet 1 mg  1 mg Oral Q6H PRN Xaviar Lunn, Lowry Ram, FNP       magnesium hydroxide (MILK OF MAGNESIA) suspension 30 mL  30 mL Oral Daily PRN Kaytlynn Kochan, Darnelle Maffucci B, FNP       multivitamin with minerals tablet 1 tablet  1 tablet Oral Daily Yomira Flitton, Lowry Ram, FNP   1 tablet at 01/04/20 0825   nicotine (NICODERM CQ - dosed in mg/24 hours) patch 21 mg  21 mg Transdermal Daily Satonya Lux, Darnelle Maffucci B, FNP   21 mg at 01/04/20 0825   ondansetron (ZOFRAN-ODT) disintegrating tablet 4 mg  4 mg Oral Q6H PRN Michial Disney, Lowry Ram, FNP       thiamine (B-1) injection 100 mg  100 mg Intramuscular Once  Clyde Upshaw, Darnelle Maffucci B, FNP       thiamine tablet 100 mg  100 mg Oral Daily Mackay Hanauer, Lowry Ram, FNP   100 mg at 01/04/20 0826   traZODone (DESYREL) tablet 50 mg  50 mg Oral QHS PRN Hazaiah Edgecombe, Darnelle Maffucci  B, FNP        Lab Results:  Results for orders placed or performed during the hospital encounter of 01/02/20 (from the past 48 hour(s))  SARS Coronavirus 2 by RT PCR (hospital order, performed in [facility] hospital lab) Nasopharyngeal Nasopharyngeal Swab     Status: None   Collection Time: 01/02/20  3:23 PM   Specimen: Nasopharyngeal Swab  Result Value Ref Range   SARS Coronavirus 2 NEGATIVE NEGATIVE    Comment: (NOTE) SARS-CoV-2 target nucleic acids are NOT DETECTED. The SARS-CoV-2 RNA is generally detectable in upper and lower respiratory specimens during the acute phase of infection. The lowest concentration of SARS-CoV-2 viral copies this assay can detect is 250 copies / mL. A negative result does not preclude SARS-CoV-2 infection and should not be used as the sole basis for treatment or other patient management decisions.  A negative result may occur with improper specimen collection / handling, submission of specimen other than nasopharyngeal swab, presence of viral mutation(s) within the areas targeted by this assay, and inadequate number of viral copies (<250 copies / mL). A negative result must be combined with clinical observations, patient history, and epidemiological information. Fact Sheet for Patients:   StrictlyIdeas.no Fact Sheet for Healthcare Providers: BankingDealers.co.za This test is not yet approved or cleared  by the [place] FDA and has been authorized for detection and/or diagnosis of SARS-CoV-2 by FDA under an Emergency Use Authorization (EUA).  This EUA will remain in effect (meaning this test can be used) for the duration of the COVID-19 declaration under Section 564(b)(1) of the Act, 21 U.S.C. section 360bbb-3(b)(1),  unless the authorization is terminated or revoked sooner. Performed at [facility], [place] [address] [place], [place] [address]     Blood Alcohol level:  Lab Results  Component Value Date   ETH <10 08/21/2018   ETH <10 [id number]    Metabolic Disorder Labs: Lab Results  Component Value Date   HGBA1C 6.1 (H) 08/23/2018   MPG 128.37 08/23/2018   MPG 131 05/04/2018   Lab Results  Component Value Date   PROLACTIN 13.9 05/04/2018   Lab Results  Component Value Date   CHOL 138 05/04/2018   TRIG 153 (H) 05/04/2018   HDL 38 (L) 05/04/2018   CHOLHDL 3.6 05/04/2018   VLDL 31 05/04/2018   LDLCALC 69 05/04/2018    Physical Findings: AIMS:  , ,  ,  ,    CIWA:  CIWA-Ar Total: 1 COWS:     Musculoskeletal: Strength & Muscle Tone: within normal limits Gait & Station: normal Patient leans: N/A  Psychiatric Specialty Exam: Physical Exam  Nursing note and vitals reviewed. Constitutional: She is oriented to person, place, and time. She appears well-developed and well-nourished.  Cardiovascular: Normal rate.  Respiratory: Effort normal.  Musculoskeletal:        General: Normal range of motion.  Neurological: She is alert and oriented to person, place, and time.  Skin: Skin is warm.  Psychiatric: She is withdrawn. She exhibits a depressed mood. She expresses suicidal ideation.    Review of Systems  Constitutional: Negative.   HENT: Negative.   Eyes: Negative.   Respiratory: Negative.   Cardiovascular: Negative.   Gastrointestinal: Negative.   Genitourinary: Negative.   Musculoskeletal: Negative.   Skin: Negative.   Neurological: Negative.   Psychiatric/Behavioral: Positive for dysphoric mood and suicidal ideas. The patient is nervous/anxious.     Blood pressure (!) 164/101, pulse 77, temperature 97.8 F (36.6 C), temperature source  Oral, resp. rate 20, height 5\' 6"  (1.676 m), weight 122.5 kg, SpO2 100 %.Body mass index is 43.58 kg/m.  General  Appearance: Disheveled and Guarded  Eye Contact:  Minimal  Speech:  Clear and Coherent and Normal Rate  Volume:  Decreased  Mood:  Depressed  Affect:  Depressed and Flat  Thought Process:  Linear and Descriptions of Associations: Intact  Orientation:  Full (Time, Place, and Person)  Thought Content:  Rumination  Suicidal Thoughts:  Yes.  without intent/plan  Homicidal Thoughts:  No  Memory:  Immediate;   Fair Recent;   Fair Remote;   Fair  Judgement:  Fair  Insight:  Fair  Psychomotor Activity:  Normal  Concentration:  Concentration: Fair  Recall:  [employer] of Knowledge:  Fair  Language:  Fair  Akathisia:  No  Handed:  Right  AIMS (if indicated):     Assets:  Desire for Improvement Financial Resources/Insurance Housing Social Support Transportation  ADL's:  Intact  Cognition:  WNL  Sleep:  Number of Hours: 6.75   Assessment: Patient presents in her room lying in the bed but is awake and the lights are off.  Patient has remained isolated and not interacting with anyone really.  She has not interacted with social work or with the providers much.  She is not even set up in the bed when being evaluated.  Patient does get irritable when asked numerous questions.  She is continued to endorse severe depression and suicidal thoughts.  She states that she has been sleeping just about every day that she has been here and at night.  Patient is encouraged to come out of her room and interact with people in the day room but she continues to refuse.  It is been reported the patient has not gotten up for meals at the right time and has refused to get up for medications when they are ordered but will take the medications as they are brought to her.  We will make some changes to patient's medications, see below, to assist with decreasing sedation during the day as well as trying to improve activity during the day.  Patient's blood pressure continues to be elevated and will start back her Norvasc at  5 mg with plan to titrate back to 10 mg a day.  Treatment Plan Summary: Daily contact with patient to assess and evaluate symptoms and progress in treatment and Medication management Start Norvasc 5 mg p.o. daily for hypertension Continue lisinopril 10 mg p.o. daily for hypertension Increase Wellbutrin XL to 300 mg p.o. daily for depression Continue Vistaril 25 mg p.o. every 6 hours as needed for anxiety agitation Continue Ativan as needed CIWA protocol Move Abilify 10 mg p.o. to nightly for bipolar disorder Continue trazodone 50 mg p.o. nightly as needed for insomnia Encourage group therapy participation Continue every 15 minute safety checks Encourage patient to exit room more often  Lewis Shock, FNP 01/04/2020, 10:00 AM

## 2020-01-04 NOTE — BHH Counselor (Signed)
CSW attempted to complete PSA with patient and discuss follow up referrals. Patient did not respond to CSW knocking at the door or calling name after entering patient's room. This is the second attempt to complete PSA.  Stephanie Acre, MSW, LCSW-A Clinical Social Worker

## 2020-01-04 NOTE — Progress Notes (Signed)
RN attempted to engage pt in conversation and establish rapport with pt.  Pt eventually responded to RN and stated "I don't want to be around people right now."  Another nurse got pt to take pt's medications this morning.  Pt has been lying in bed with eyes closed for the majority of this shift and she has not been motivated whatsoever to get out of bed.  RN encouraged pt to interact with RN and other patients/staff and pt declined.  Pt denied having SI/HI/AVH.  Q 15 min safety check protocols remained in place.  RN will continue to monitor and provide support as needed.

## 2020-01-05 MED ORDER — ARIPIPRAZOLE 10 MG PO TABS: 10.0000 mg | ORAL_TABLET | Freq: Every day | ORAL | 0 refills | Status: AC

## 2020-01-05 MED ORDER — AMLODIPINE BESYLATE 5 MG PO TABS
5.0000 mg | ORAL_TABLET | Freq: Once | ORAL | Status: AC
  Administered 2020-01-05: 5 mg via ORAL
  Filled 2020-01-05 (×2): qty 1

## 2020-01-05 MED ORDER — AMLODIPINE BESYLATE 10 MG PO TABS: 10.0000 mg | ORAL_TABLET | Freq: Every day | ORAL | 0 refills | Status: AC

## 2020-01-05 MED ORDER — LISINOPRIL 10 MG PO TABS: 10.0000 mg | ORAL_TABLET | Freq: Every day | ORAL | 0 refills | Status: AC

## 2020-01-05 MED ORDER — AMLODIPINE BESYLATE 10 MG PO TABS
10.0000 mg | ORAL_TABLET | Freq: Every day | ORAL | Status: DC
  Filled 2020-01-05: qty 1

## 2020-01-05 MED ORDER — BUPROPION HCL ER (XL) 300 MG PO TB24: 300.0000 mg | ORAL_TABLET | Freq: Every day | ORAL | 0 refills | Status: AC

## 2020-01-05 MED ORDER — TRAZODONE HCL 50 MG PO TABS: 50.0000 mg | ORAL_TABLET | Freq: Every evening | ORAL | 0 refills | Status: AC | PRN

## 2020-01-05 NOTE — Progress Notes (Signed)
Recreation Therapy Notes  Date: 6.4.21 Time: 0930 Location: 300 Hall Group Room  Group Topic: Stress Management  Goal Area(s) Addresses:  Patient will identify positive stress management techniques. Patient will identify benefits of using stress management post d/c.  Intervention: Stress Management  Activity:  Guided Imagery.  LRT read a script that lead patients to enjoy the peaceful waves at the beach at sunrise.  Patients were to listen and follow along as script was read to engage in activity.  Education:  Stress Management, Discharge Planning.   Education Outcome: Acknowledges Education  Clinical Observations/Feedback: Pt did not attend activity.    Victorino Sparrow, LRT/CTRS         Victorino Sparrow A 01/05/2020 11:28 AM

## 2020-01-05 NOTE — Plan of Care (Signed)
Patient stayed in bed sleeping. Presented to the medication window for bedtime medications. Alert and oriented and denying suicidal thoughts. Guarded, angry and irritable, reporting that she does not want to be around people. Prefers to be Isolative in room.  Received medications and went back to bed. Has been sleeping. Safety monitored as recommended.

## 2020-01-05 NOTE — Discharge Summary (Signed)
Physician Discharge Summary Note  Patient:  Felicia Foster is an 34 y.o., female MRN:  [id number] DOB:  06-02-1986 Patient phone:  [phone] (home)  Patient address:   [place] [address],  Total Time spent with patient: 30 minutes  Date of Admission:  01/02/2020 Date of Discharge: 01/05/20  Reason for Admission:  34 y.o.female.Patient presents as a walk-in reporting that she is feeling very depressed and having suicidal thoughts her lately. She states that she recently had a couple of friends that 1 by overdose. She states that she has been having thoughts about driving her car off the road and killing herself and at one point she had her 13-year-old in the car with her when she had these thoughts. She states that she has been off of her psychiatric medications for a couple of months.   Principal Problem: Bipolar I disorder, most recent episode depressed [facility]) Discharge Diagnoses: Principal Problem:   Bipolar I disorder, most recent episode depressed [facility])   Past Psychiatric History: Patient presents with multiple hospitalizations with the last being documented as February 2020 [facility].  Patient reports numerous suicide attempts of various causes including overdose.  Patient was reporting active with [facility] for outpatient follow-up  Past Medical History:  Past Medical History:  Diagnosis Date  . Diabetes mellitus without complication ([place])   . Fibroids   . Gestational diabetes   . Hypertension   . Mental disorder    bipolar    Past Surgical History:  Procedure Laterality Date  . CESAREAN SECTION     x 4  . CESAREAN SECTION N/A 08/08/2014   Procedure: CESAREAN SECTION;  Surgeon: Truett Mainland, DO;  Location: [facility];  Service: Obstetrics;  Laterality: N/A;  repeat  . DILATION AND CURETTAGE OF UTERUS     Family History:  Family History  Problem Relation Age of Onset  . Cancer Mother    Family Psychiatric  History: None reported Social History:   Social History   Substance and Sexual Activity  Alcohol Use Yes  . Alcohol/week: 10.0 - 12.0 standard drinks  . Types: 10 - 12 Cans of beer per week   Comment: Pt admits drinking daily     Social History   Substance and Sexual Activity  Drug Use Yes  . Types: Cocaine    Social History   Socioeconomic History  . Marital status: Single    Spouse name: Not on file  . Number of children: Not on file  . Years of education: Not on file  . Highest education level: Not on file  Occupational History  . Not on file  Tobacco Use  . Smoking status: Current Some Day Smoker    Packs/day: 0.50    Types: Cigarettes  . Smokeless tobacco: Never Used  . Tobacco comment: Pt declined  Substance and Sexual Activity  . Alcohol use: Yes    Alcohol/week: 10.0 - 12.0 standard drinks    Types: 10 - 12 Cans of beer per week    Comment: Pt admits drinking daily  . Drug use: Yes    Types: Cocaine  . Sexual activity: Yes    Birth control/protection: None  Other Topics Concern  . Not on file  Social History Narrative   ** Merged History Encounter **       Social Determinants of Health   Financial Resource Strain:   . Difficulty of Paying Living Expenses:   Food Insecurity:   . Worried About [employer] of  Food in the Last Year:   . [place] in the Last Year:   Transportation Needs:   . Film/video editor (Medical):   Mar[place] Kitchen Lack of Transportation (Non-Medical):   Physical Activity:   . Days of Exercise per Week:   . Minutes of Exercise per Session:   Stress:   . Feeling of Stress :   Social Connections:   . Frequency of Communication with Friends and Family:   . Frequency of Social Gatherings with Friends and Family:   . Attends Religious Services:   . Active Member of Clubs or Organizations:   . Attends Archivist Meetings:   Marland Kitchen Marital Status:     Hospital Course:  Patient remained on the [facility] unit for 2 days. The patient stabilized on medication and therapy.  Patient was discharged on Norvasc 10 mg daily, Abilify 10 mg daily, Wellbutrin XL 300 mg daily, lisinopril 10 mg daily, and trazodone 50 mg nightly as needed. Patient has shown improvement with improved mood, affect, sleep, appetite, and interaction. Patient has attended group and participated. Patient has been seen in the day room interacting with peers and staff appropriately. Patient denies any SI/HI/AVH and contracts for safety. Patient agrees to follow up at [place]. Patient is provided with prescriptions for their medications upon discharge.  Physical Findings: AIMS:  , ,  ,  ,    CIWA:  CIWA-Ar Total: 2 COWS:     Musculoskeletal: Strength & Muscle Tone: within normal limits Gait & Station: normal Patient leans: N/A  Psychiatric Specialty Exam: Physical Exam  Nursing note and vitals reviewed. Constitutional: She is oriented to person, place, and time. She appears well-developed and well-nourished.  Cardiovascular: Normal rate.  Respiratory: Effort normal.  Musculoskeletal:        General: Normal range of motion.  Neurological: She is alert and oriented to person, place, and time.  Skin: Skin is warm.    Review of Systems  Constitutional: Negative.   HENT: Negative.   Eyes: Negative.   Respiratory: Negative.   Cardiovascular: Negative.   Gastrointestinal: Negative.   Genitourinary: Negative.   Musculoskeletal: Negative.   Skin: Negative.   Neurological: Negative.   Psychiatric/Behavioral: Negative.     Blood pressure (!) 149/115, pulse 89, temperature 97.8 F (36.6 C), temperature source Oral, resp. rate 20, height 5\' 6"  (1.676 m), weight 122.5 kg, SpO2 100 %.Body mass index is 43.58 kg/m.   General Appearance: Disheveled  Eye Contact::  Good  Speech:  Normal Rate409  Volume:  Normal  Mood:  Euthymic  Affect:  Congruent  Thought Process:  Coherent and Descriptions of Associations: Intact  Orientation:  Full (Time, Place, and Person)  Thought Content:   Logical  Suicidal Thoughts:  No  Homicidal Thoughts:  No  Memory:  Immediate;   Fair Recent;   Fair Remote;   Fair  Judgement:  Intact  Insight:  Fair  Psychomotor Activity:  Normal  Concentration:  Good  Recall:  [place] of Knowledge:Fair  Language: Fair  Akathisia:  Negative  Handed:  Right  AIMS (if indicated):     Assets:  Desire for Improvement Resilience  Sleep:  Number of Hours: 6.75  Cognition: WNL  ADL's:  Intact      Has this patient used any form of tobacco in the last 30 days? (Cigarettes, Smokeless Tobacco, Cigars, and/or Pipes) Yes, No  Blood Alcohol level:  Lab Results  Component Value Date   ETH <10 08/21/2018  ETH <10 25/42/7062    Metabolic Disorder Labs:  Lab Results  Component Value Date   HGBA1C 6.1 (H) 08/23/2018   MPG 128.37 08/23/2018   MPG 131 05/04/2018   Lab Results  Component Value Date   PROLACTIN 13.9 05/04/2018   Lab Results  Component Value Date   CHOL 138 05/04/2018   TRIG 153 (H) 05/04/2018   HDL 38 (L) 05/04/2018   CHOLHDL 3.6 05/04/2018   VLDL 31 05/04/2018   LDLCALC 69 05/04/2018    See Psychiatric Specialty Exam and Suicide Risk Assessment completed by Attending Physician prior to discharge.  Discharge destination:  Home  Is patient on multiple antipsychotic therapies at discharge:  No   Has Patient had three or more failed trials of antipsychotic monotherapy by history:  No  Recommended Plan for Multiple Antipsychotic Therapies: NA   Allergies as of 01/05/2020   No Known Allergies     Medication List    TAKE these medications     Indication  amLODipine 10 MG tablet Commonly known as: NORVASC Take 1 tablet (10 mg total) by mouth daily. Start taking on: January 06, 2020  Indication: High Blood Pressure Disorder   ARIPiprazole 10 MG tablet Commonly known as: ABILIFY Take 1 tablet (10 mg total) by mouth at bedtime.  Indication: Major Depressive Disorder   buPROPion 300 MG 24 hr tablet Commonly known  as: WELLBUTRIN XL Take 1 tablet (300 mg total) by mouth daily. Start taking on: January 06, 2020  Indication: Major Depressive Disorder   lisinopril 10 MG tablet Commonly known as: ZESTRIL Take 1 tablet (10 mg total) by mouth daily. Start taking on: January 06, 2020  Indication: High Blood Pressure Disorder   traZODone 50 MG tablet Commonly known as: DESYREL Take 1 tablet (50 mg total) by mouth at bedtime as needed for sleep.  Indication: [facility]. Go on 01/09/2020.   Specialty: Behavioral Health Why: You have an appointment on 01/09/20 at 10:00 am.  This appointmentment will be held in person.  The provider will text you the day prior to this appointment with details.  Contact information: [address] [place] [address] [phone]           Follow-up recommendations:  Continue activity as tolerated. Continue diet as recommended by your PCP. Ensure to keep all appointments with outpatient providers.  Comments:  Patient is instructed prior to discharge to: Take all medications as prescribed by his/her mental healthcare provider. Report any adverse effects and or reactions from the medicines to his/her outpatient provider promptly. Patient has been instructed & cautioned: To not engage in alcohol and or illegal drug use while on prescription medicines. In the event of worsening symptoms, patient is instructed to call the crisis hotline, 911 and or go to the nearest ED for appropriate evaluation and treatment of symptoms. To follow-up with his/her primary care provider for your other medical issues, concerns and or health care needs.    Signed: Lowry Ram Baeleigh Devincent, FNP 01/05/2020, 11:12 AM

## 2020-01-05 NOTE — Progress Notes (Signed)
°  [facility] Adult Case Management Discharge Plan :  Will you be returning to the same living situation after discharge:  Yes,  patient is returning home with her family At discharge, do you have transportation home?: Yes,  patient reports her daughter is picking her up Do you have the ability to pay for your medications: Yes,  [employer]  Release of information consent forms completed and in the chart;  Patient's signature needed at discharge.  Patient to Follow up at: [facility]. Go on 01/09/2020.   Specialty: Behavioral Health Why: You have an appointment on 01/09/20 at 10:00 am.  This appointmentment will be held in person. The provider will text you the day prior to this appointment with details. Be sure to provide any discharge paperwork from this hospitalization. Contact information: [place] [address] [phone]           Next level of care provider has access to [place] and Suicide Prevention discussed: Yes,  with the patient     Has patient been referred to the Quitline?: Patient refused referral  Patient has been referred for addiction treatment: N/A  Marylee Floras, LCSWA 01/05/2020, 11:21 AM

## 2020-01-05 NOTE — BHH Suicide Risk Assessment (Signed)
[place] [place] Discharge Suicide Risk Assessment   Principal Problem: Bipolar I disorder, most recent episode depressed ([place]) Discharge Diagnoses: Principal Problem:   Bipolar I disorder, most recent episode depressed ([place])   Total Time spent with patient: 15 minutes  Musculoskeletal: Strength & Muscle Tone: within normal limits Gait & Station: normal Patient leans: N/A  Psychiatric Specialty Exam: Review of Systems  All other systems reviewed and are negative.   Blood pressure (!) 162/107, pulse 85, temperature 97.8 F (36.6 C), temperature source Oral, resp. rate 20, height 5\' 6"  (1.676 m), weight 122.5 kg, SpO2 99 %.Body mass index is 43.58 kg/m.  General Appearance: Disheveled  Eye Contact::  Good  Speech:  Normal Rate409  Volume:  Normal  Mood:  Euthymic  Affect:  Congruent  Thought Process:  Coherent and Descriptions of Associations: Intact  Orientation:  Full (Time, Place, and Person)  Thought Content:  Logical  Suicidal Thoughts:  No  Homicidal Thoughts:  No  Memory:  Immediate;   Fair Recent;   Fair Remote;   Fair  Judgement:  Intact  Insight:  Fair  Psychomotor Activity:  Normal  Concentration:  Good  Recall:  [employer] of Knowledge:Fair  Language: Fair  Akathisia:  Negative  Handed:  Right  AIMS (if indicated):     Assets:  Desire for Improvement Resilience  Sleep:  Number of Hours: 6.75  Cognition: WNL  ADL's:  Intact   Mental Status Per Nursing Assessment::   On Admission:  Suicidal ideation indicated by patient, Self-harm thoughts, Suicide plan  Demographic Factors:  Divorced or widowed and Low socioeconomic status  Loss Factors: Financial problems/change in socioeconomic status  Historical Factors: Impulsivity  Risk Reduction Factors:   Living with another person, especially a relative  Continued Clinical Symptoms:  Depression:   Impulsivity  Cognitive Features That Contribute To Risk:  None    Suicide Risk:  Minimal: No identifiable  suicidal ideation.  Patients presenting with no risk factors but with morbid ruminations; may be classified as minimal risk based on the severity of the depressive symptoms    Plan Of Care/Follow-up recommendations:  Activity:  ad lib  Sharma Covert, MD 01/05/2020, 9:02 AM

## 2020-01-05 NOTE — Progress Notes (Signed)
RN met with pt and reviewed pt's discharge instructions.  Pt verbalized understanding of discharge instructions and pt did not have any questions. RN reviewed and provided pt with a copy of SRA, AVS and Transition Record.  RN returned pt's belongings to pt.  Pt denied SI/HI/AVH.  Patient discharged to the lobby where pt's adult daughter was waiting to take pt home.

## 2020-05-05 IMAGING — CT CT ABDOMEN AND PELVIS WITH CONTRAST
2 of 4 series · 17 of 46 positions shown, 19 images · IV contrast (omnipaque)
Comparison: None.

CLINICAL DATA: Low back pain with burning on urination, initial
encounter

EXAM:
CT ABDOMEN AND PELVIS WITH CONTRAST
TECHNIQUE: Multidetector CT imaging of the abdomen and pelvis was performed
using the standard protocol following bolus administration of
intravenous contrast.
CONTRAST:  100mL OMNIPAQUE IOHEXOL 300 MG/ML  SOLN

[Series 3: a/p w/ 5mm · axial · 0.92mm/px · z∈[+858,+1323]mm · 14 of 103 slices shown, 16 images]
[im 5/103  soft-tissue]
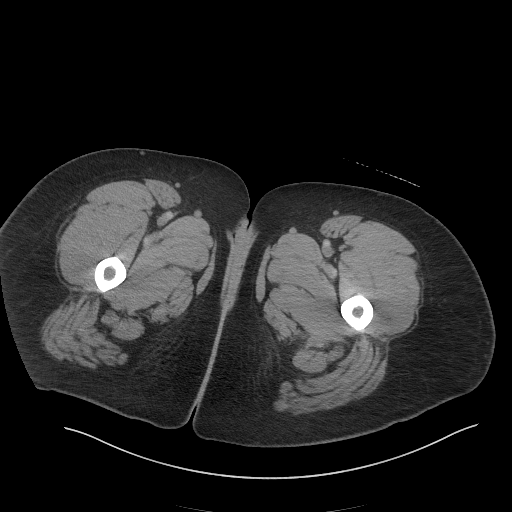
[im 5/103  bone]
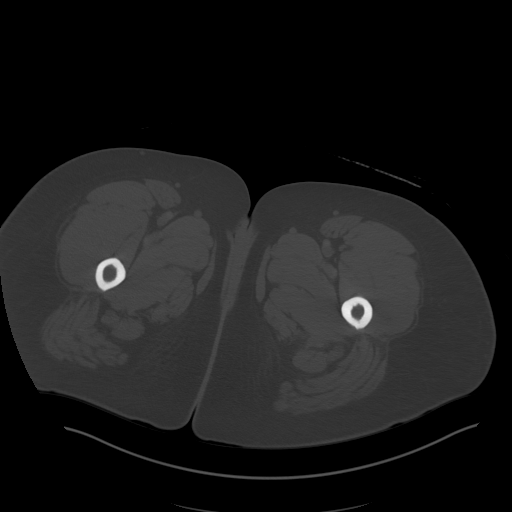
[im 13/103  soft-tissue]
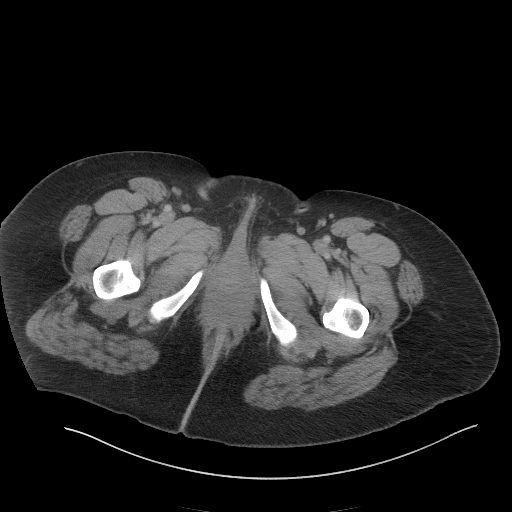
[im 22/103  soft-tissue]
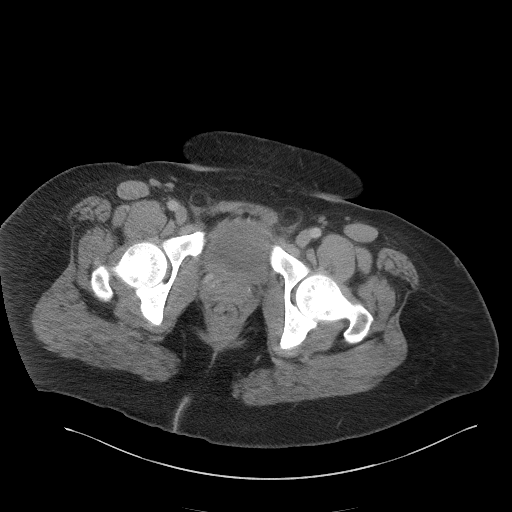
[im 26/103  soft-tissue]
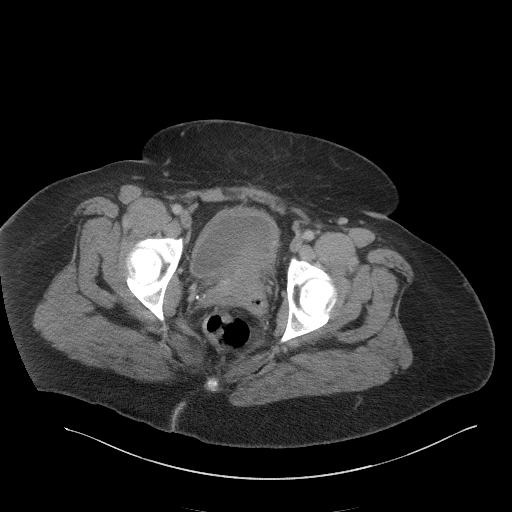
[im 35/103  soft-tissue]
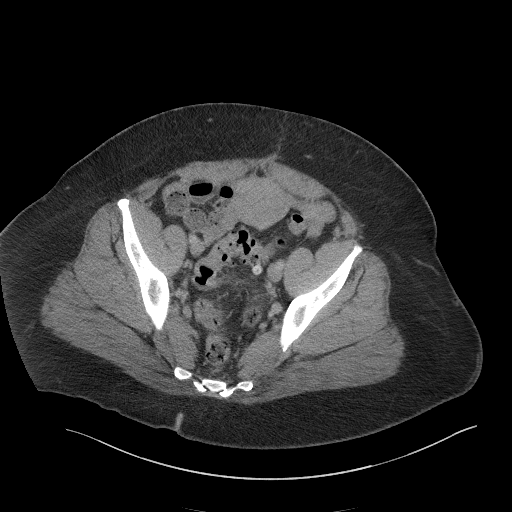
[im 43/103  soft-tissue]
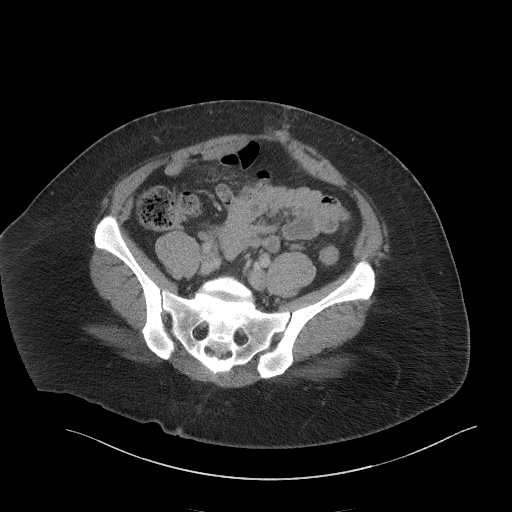
[im 47/103  soft-tissue]
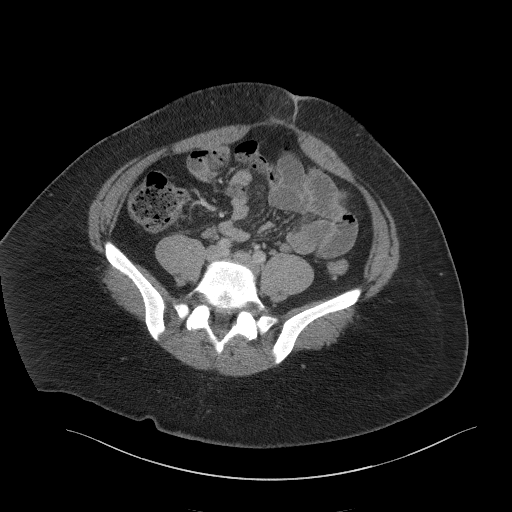
[im 56/103  soft-tissue]
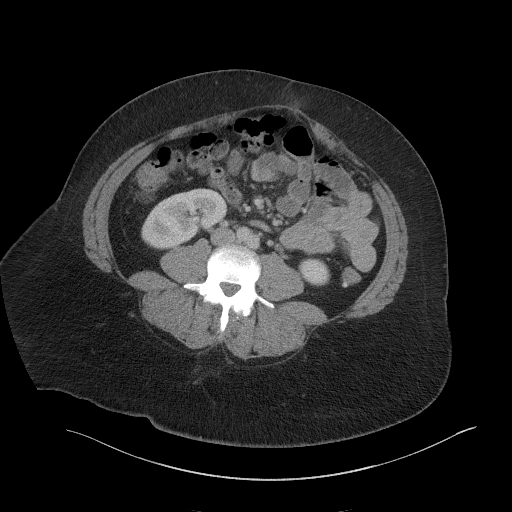
[im 60/103  soft-tissue]
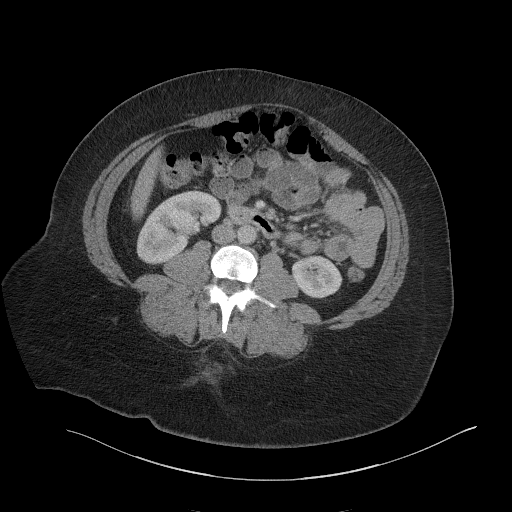
[im 60/103  bone]
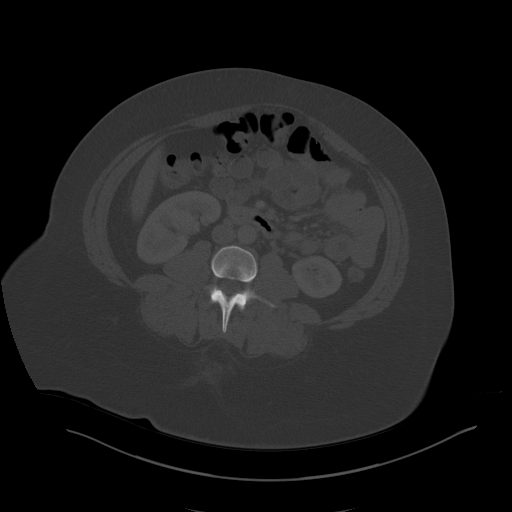
[im 69/103  soft-tissue]
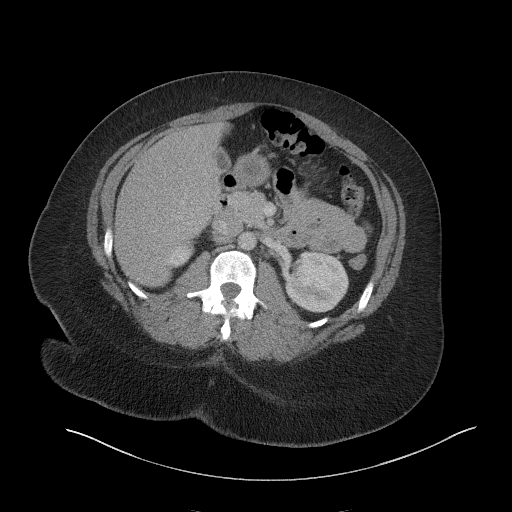
[im 77/103  soft-tissue]
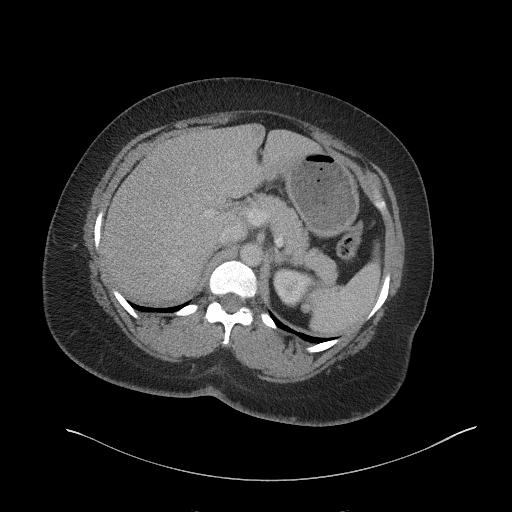
[im 81/103  soft-tissue]
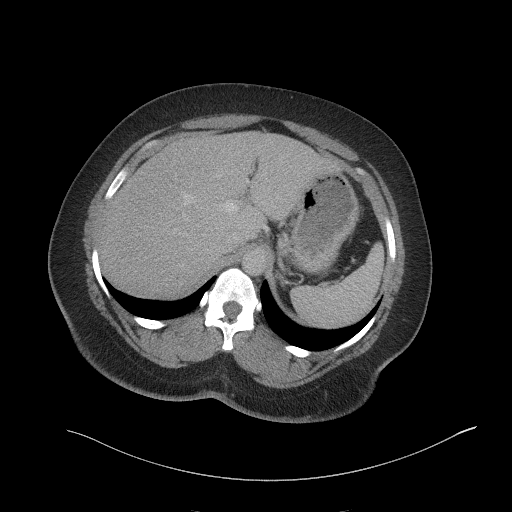
[im 90/103  soft-tissue]
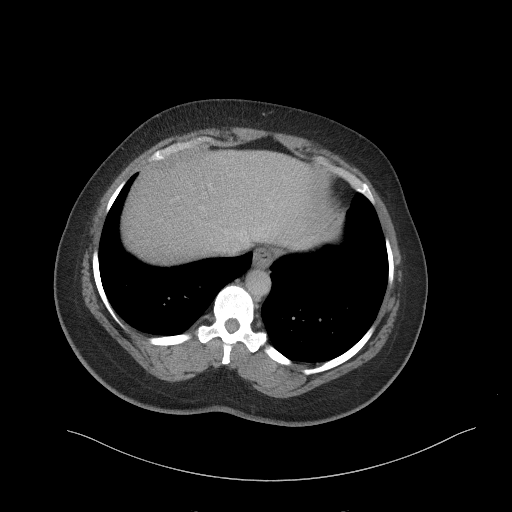
[im 98/103  soft-tissue]
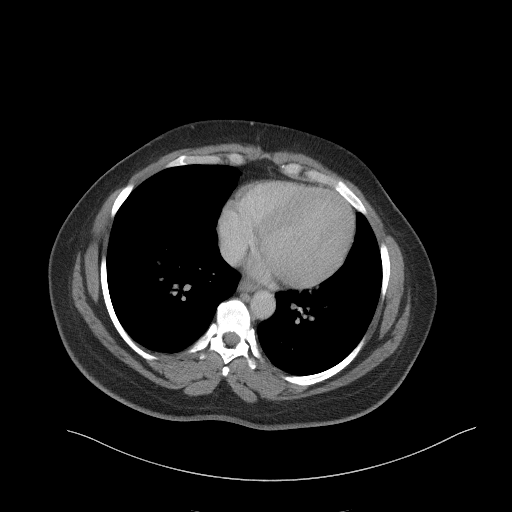

[Series 6: a/p w/ cor · coronal · 0.93mm/px · 3 of 200 slices shown]
[im 67/200  soft-tissue]
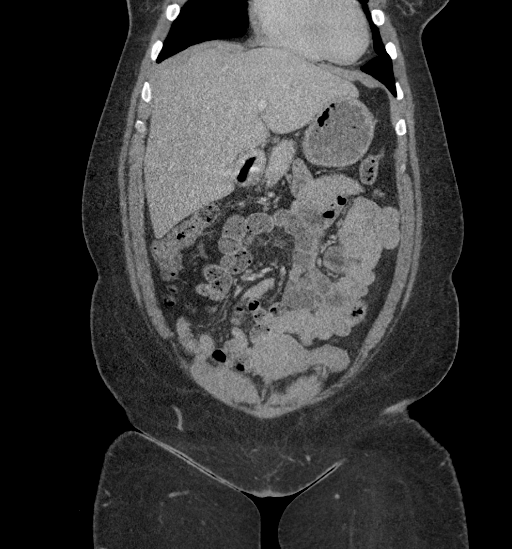
[im 89/200  soft-tissue]
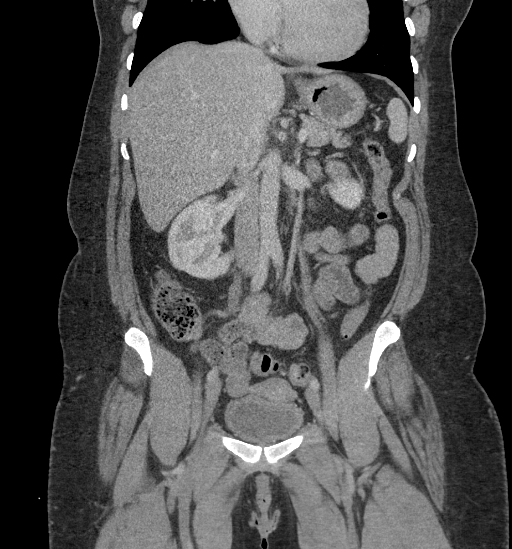
[im 111/200  soft-tissue]
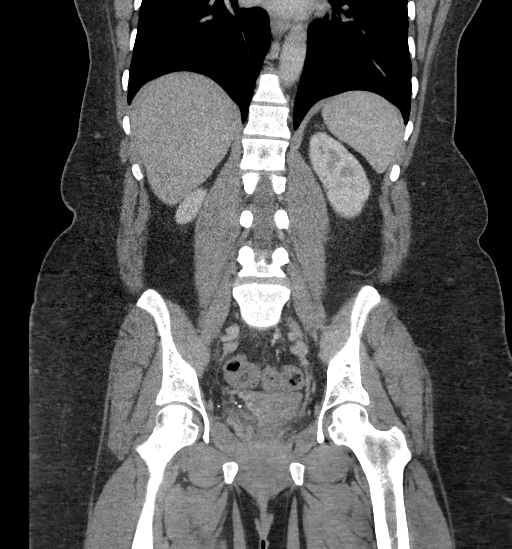

[17 of 46 positions shown; findings below may reference images not displayed]

FINDINGS: Lower chest: No acute abnormality.

Hepatobiliary: No focal liver abnormality is seen. No gallstones,
gallbladder wall thickening, or biliary dilatation.

Pancreas: Unremarkable. No pancreatic ductal dilatation or
surrounding inflammatory changes.

Spleen: Normal in size without focal abnormality.

Adrenals/Urinary Tract: Adrenal glands are unremarkable. Kidneys are
normal, without renal calculi, focal lesion, or hydronephrosis.
Bladder is decompressed

Stomach/Bowel: Diverticular change of the colon is noted without
evidence of diverticulitis. The appendix is within normal limits. No
obstructive or inflammatory changes of the small bowel are seen. The
stomach is within normal limits.

Vascular/Lymphatic: No significant vascular findings are present. No
enlarged abdominal or pelvic lymph nodes.

Reproductive: Uterus and bilateral adnexa are unremarkable.

Other: Small fat containing umbilical hernia is noted. Very minimal
inflammatory changes are seen. No abdominopelvic ascites.

Musculoskeletal: No acute or significant osseous findings.
IMPRESSION: Diverticular change of the colon without evidence of diverticulitis.

Small fat containing umbilical hernia with mild inflammatory change.

No other focal abnormality is noted.
